# Patient Record
Sex: Female | Born: 2000
Health system: Southern US, Community
[De-identification: ages and names within clinical notes are randomized; demographics above are authoritative.]

## PROBLEM LIST (undated history)

## (undated) DIAGNOSIS — B977 Papillomavirus as the cause of diseases classified elsewhere: Secondary | ICD-10-CM

## (undated) DIAGNOSIS — N201 Calculus of ureter: Secondary | ICD-10-CM

## (undated) DIAGNOSIS — D369 Benign neoplasm, unspecified site: Secondary | ICD-10-CM

## (undated) DIAGNOSIS — K580 Irritable bowel syndrome with diarrhea: Secondary | ICD-10-CM

## (undated) DIAGNOSIS — Z9889 Other specified postprocedural states: Secondary | ICD-10-CM

## (undated) DIAGNOSIS — Z872 Personal history of diseases of the skin and subcutaneous tissue: Secondary | ICD-10-CM

## (undated) DIAGNOSIS — K589 Irritable bowel syndrome without diarrhea: Secondary | ICD-10-CM

## (undated) HISTORY — PX: TONSILLECTOMY: SUR1361

## (undated) HISTORY — PX: TUMOR REMOVAL: SHX12

## (undated) HISTORY — PX: WISDOM TOOTH EXTRACTION: SHX21

---
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
## 2019-03-18 ENCOUNTER — Emergency Department
Admission: EM | Admit: 2019-03-18 | Discharge: 2019-03-19 | Disposition: A | Discharge status: Home / Self Care | Payer: 59 | Attending: Emergency Medicine | Admitting: Emergency Medicine

## 2019-03-18 ENCOUNTER — Other Ambulatory Visit: Payer: Self-pay

## 2019-03-18 ENCOUNTER — Emergency Department: Payer: 59

## 2019-03-18 DIAGNOSIS — K529 Noninfective gastroenteritis and colitis, unspecified: Secondary | ICD-10-CM | POA: Diagnosis not present

## 2019-03-18 DIAGNOSIS — R1031 Right lower quadrant pain: Secondary | ICD-10-CM | POA: Diagnosis present

## 2019-03-18 DIAGNOSIS — Z79899 Other long term (current) drug therapy: Secondary | ICD-10-CM | POA: Diagnosis not present

## 2019-03-18 HISTORY — DX: Irritable bowel syndrome without diarrhea: K58.9

## 2019-03-18 HISTORY — DX: Benign neoplasm, unspecified site: D36.9

## 2019-03-18 LAB — URINALYSIS, COMPLETE (UACMP) WITH MICROSCOPIC
Bacteria, UA: NONE SEEN
Bilirubin Urine: NEGATIVE
Glucose, UA: NEGATIVE mg/dL
Ketones, ur: NEGATIVE mg/dL
Leukocytes,Ua: NEGATIVE
Nitrite: NEGATIVE
Protein, ur: NEGATIVE mg/dL
Specific Gravity, Urine: 1.018 (ref 1.005–1.030)
pH: 5 (ref 5.0–8.0)

## 2019-03-18 LAB — COMPREHENSIVE METABOLIC PANEL
ALT: 29 U/L (ref 0–44)
AST: 21 U/L (ref 15–41)
Albumin: 4.2 g/dL (ref 3.5–5.0)
Alkaline Phosphatase: 95 U/L (ref 38–126)
Anion gap: 6 (ref 5–15)
BUN: 7 mg/dL (ref 6–20)
CO2: 25 mmol/L (ref 22–32)
Calcium: 9.4 mg/dL (ref 8.9–10.3)
Chloride: 105 mmol/L (ref 98–111)
Creatinine, Ser: 0.79 mg/dL (ref 0.44–1.00)
GFR calc Af Amer: >60 mL/min (ref >60)
GFR calc non Af Amer: >60 mL/min (ref >60)
Glucose, Bld: 102 mg/dL — ABNORMAL HIGH (ref 70–99)
Potassium: 3.9 mmol/L (ref 3.5–5.1)
Sodium: 136 mmol/L (ref 135–145)
Total Bilirubin: 0.7 mg/dL (ref 0.3–1.2)
Total Protein: 8.1 g/dL (ref 6.5–8.1)

## 2019-03-18 LAB — CBC
HCT: 45 % (ref 36.0–46.0)
Hemoglobin: 15.5 g/dL — ABNORMAL HIGH (ref 12.0–15.0)
MCH: 30.1 pg (ref 26.0–34.0)
MCHC: 34.4 g/dL (ref 30.0–36.0)
MCV: 87.4 fL (ref 80.0–100.0)
Platelets: 410 10*3/uL — ABNORMAL HIGH (ref 150–400)
RBC: 5.15 MIL/uL — ABNORMAL HIGH (ref 3.87–5.11)
RDW: 11.6 % (ref 11.5–15.5)
WBC: 10.4 10*3/uL (ref 4.0–10.5)
nRBC: 0 % (ref 0.0–0.2)

## 2019-03-18 LAB — POCT PREGNANCY, URINE: Preg Test, Ur: NEGATIVE

## 2019-03-18 LAB — LIPASE, BLOOD: Lipase: 18 U/L (ref 11–51)

## 2019-03-18 MED ORDER — NAPROXEN 500 MG PO TABS: 500.0000 mg | ORAL_TABLET | Freq: Two times a day (BID) | ORAL | 0 refills | Status: DC

## 2019-03-18 MED ORDER — SODIUM CHLORIDE 0.9% FLUSH: 3.0000 mL | Freq: Once | INTRAVENOUS | Status: DC

## 2019-03-18 MED ORDER — ONDANSETRON 4 MG PO TBDP: 4.0000 mg | ORAL_TABLET | Freq: Three times a day (TID) | ORAL | 0 refills | Status: DC | PRN

## 2019-03-18 MED ORDER — SODIUM CHLORIDE 0.9 % IV BOLUS
1000.0000 mL | Freq: Once | INTRAVENOUS | Status: AC
  Administered 2019-03-18: 21:00:00 1000 mL via INTRAVENOUS

## 2019-03-18 MED ORDER — IOHEXOL 300 MG/ML  SOLN
100.0000 mL | Freq: Once | INTRAMUSCULAR | Status: AC | PRN
  Administered 2019-03-18: 22:00:00 100 mL via INTRAVENOUS

## 2019-03-18 MED ORDER — METRONIDAZOLE 500 MG PO TABS: 500.0000 mg | ORAL_TABLET | Freq: Three times a day (TID) | ORAL | 0 refills | Status: DC

## 2019-03-18 NOTE — ED Notes (Signed)
Patient to CT at this time

## 2019-03-18 NOTE — ED Triage Notes (Signed)
Pt to the er for abd pain. Pt states she has a hx of IBS and is taking her medication. Pt reports this pain being more sever and not the normal. Pain was over her entire abd earlier, pt took pepto with sone relief. Now the pain is RLQ.

## 2019-03-18 NOTE — ED Provider Notes (Signed)
[facility] Emergency Department Provider Note  ____________________________________________  Time seen: Approximately 11:12 PM  I have reviewed the triage vital signs and the nursing notes.   HISTORY  Chief Complaint Abdominal Pain    HPI Krystal Morton is a 19 y.o. female pes planovalgus who comes to the ED complaining of abdominal pain in the right lower quadrant, worsening for the past 2 days.  Decreased appetite today.  Associated nausea as well as diarrhea.  No body aches fevers or chills.  Nonradiating, no aggravating or alleviating factors, moderate      Past Medical History:  Diagnosis Date  . Benign tumor   . IBS (irritable bowel syndrome)      There are no problems to display for this patient.    Past Surgical History:  Procedure Laterality Date  . TONSILLECTOMY    . TUMOR REMOVAL    . WISDOM TOOTH EXTRACTION       Prior to Admission medications   Medication Sig Start Date End Date Taking? Authorizing Provider  dicyclomine (BENTYL) 20 MG tablet Take 1 tablet (20 mg total) by mouth 2 (two) times daily. 03/18/14   Harden Mo, MD  diphenoxylate-atropine (LOMOTIL) 2.5-0.025 MG per tablet Take 1 tablet by mouth 4 (four) times daily as needed for diarrhea or loose stools. 03/18/14   Harden Mo, MD  lactobacillus acidophilus & bulgar (LACTINEX) chewable tablet Chew 1 tablet by mouth 3 (three) times daily with meals. 03/18/14   Harden Mo, MD  metroNIDAZOLE (FLAGYL) 500 MG tablet Take 1 tablet (500 mg total) by mouth 3 (three) times daily. 03/18/19   Carrie Mew, MD  naproxen (NAPROSYN) 500 MG tablet Take 1 tablet (500 mg total) by mouth 2 (two) times daily with a meal. 03/18/19   Carrie Mew, MD  ondansetron (ZOFRAN ODT) 4 MG disintegrating tablet Take 1 tablet (4 mg total) by mouth every 8 (eight) hours as needed for nausea or vomiting. 03/18/19   Carrie Mew, MD     Allergies Dicyclomine   No family history  on file.  Social History Social History   Tobacco Use  . Smoking status: Never Smoker  . Smokeless tobacco: Never Used  Substance Use Topics  . Alcohol use: No  . Drug use: No    Review of Systems  Constitutional:   No fever or chills.  ENT:   No sore throat. No rhinorrhea. Cardiovascular:   No chest pain or syncope. Respiratory:   No dyspnea or cough. Gastrointestinal: Positive as above for abdominal pain and diarrhea. Musculoskeletal:   Negative for focal pain or swelling All other systems reviewed and are negative except as documented above in ROS and HPI.  ____________________________________________   PHYSICAL EXAM:  VITAL SIGNS: ED Triage Vitals [03/18/19 1651]  Enc Vitals Group     BP (!) 151/87     Pulse Rate 86     Resp 16     Temp 98.2 F (36.8 C)     Temp Source Oral     SpO2 99 %     Weight 175 lb (79.4 kg)     Height 5\' 6"  (1.676 m)     Head Circumference      Peak Flow      Pain Score 4     Pain Loc      Pain Edu?      Excl. in [place]?     Vital signs reviewed, nursing assessments reviewed.   Constitutional:   Alert and  oriented. Non-toxic appearance. Eyes:   Conjunctivae are normal. EOMI. PERRL. ENT      Head:   Normocephalic and atraumatic.      Nose:   Wearing a mask.      Mouth/Throat:   Wearing a mask.      Neck:   No meningismus. Full ROM. Hematological/Lymphatic/Immunilogical:   No cervical lymphadenopathy. Cardiovascular:   RRR. Symmetric bilateral radial and DP pulses.  No murmurs. Cap refill less than 2 seconds. Respiratory:   Normal respiratory effort without tachypnea/retractions. Breath sounds are clear and equal bilaterally. No wheezes/rales/rhonchi. Gastrointestinal:   Soft with focal right lower quadrant tenderness. Non distended. There is no CVA tenderness.  No rebound, rigidity, or guarding. Musculoskeletal:   Normal range of motion in all extremities. No joint effusions.  No lower extremity tenderness.  No edema. Neurologic:    Normal speech and language.  Motor grossly intact. No acute focal neurologic deficits are appreciated.  Skin:    Skin is warm, dry and intact. No rash noted.  No petechiae, purpura, or bullae.  ____________________________________________    LABS (pertinent positives/negatives) (all labs ordered are listed, but only abnormal results are displayed) Labs Reviewed  COMPREHENSIVE METABOLIC PANEL - Abnormal; Notable for the following components:      Result Value   Glucose, Bld 102 (*)    All other components within normal limits  CBC - Abnormal; Notable for the following components:   RBC 5.15 (*)    Hemoglobin 15.5 (*)    Platelets 410 (*)    All other components within normal limits  URINALYSIS, COMPLETE (UACMP) WITH MICROSCOPIC - Abnormal; Notable for the following components:   Color, Urine YELLOW (*)    APPearance CLEAR (*)    Hgb urine dipstick SMALL (*)    All other components within normal limits  LIPASE, BLOOD  POC URINE PREG, ED  POCT PREGNANCY, URINE   ____________________________________________   EKG    ____________________________________________    RADIOLOGY  CT ABDOMEN PELVIS W CONTRAST  Result Date: 03/18/2019 CLINICAL DATA:  Right lower quadrant abdominal pain. EXAM: CT ABDOMEN AND PELVIS WITH CONTRAST TECHNIQUE: Multidetector CT imaging of the abdomen and pelvis was performed using the standard protocol following bolus administration of intravenous contrast. CONTRAST:  111mL OMNIPAQUE IOHEXOL 300 MG/ML  SOLN COMPARISON:  None. FINDINGS: Lower chest: The lung bases are clear. The heart size is normal. Hepatobiliary: The liver is normal. Normal gallbladder.There is no biliary ductal dilation. Pancreas: Normal contours without ductal dilatation. No peripancreatic fluid collection. Spleen: No splenic laceration or hematoma. Adrenals/Urinary Tract: --Adrenal glands: No adrenal hemorrhage. --Right kidney/ureter: No hydronephrosis or perinephric hematoma. --Left  kidney/ureter: There is a nonobstructing 4 mm stone in the interpolar region of the left kidney. --Urinary bladder: Unremarkable. Stomach/Bowel: --Stomach/Duodenum: No hiatal hernia or other gastric abnormality. Normal duodenal course and caliber. --Small bowel: There may be some mild wall thickening of the terminal ileum. --Colon: No focal abnormality. --Appendix: Normal. Vascular/Lymphatic: Normal course and caliber of the major abdominal vessels. --No retroperitoneal lymphadenopathy. --there are few enlarged lymph nodes in the right lower quadrant measuring up to approximately 2 cm. --No pelvic or inguinal lymphadenopathy. Reproductive: Unremarkable Other: No ascites or free air. The abdominal wall is normal. Musculoskeletal. No acute displaced fractures. IMPRESSION: 1. Possible mild wall thickening of the terminal ileum with enlarged right lower quadrant lymph nodes can be seen in patients with mesenteric adenitis or infectious inflammatory enteritis. 2. Normal appendix. 3. Nonobstructive left nephrolithiasis. Electronically Signed   By:  Constance Holster M.D.   On: 03/18/2019 22:08    ____________________________________________   PROCEDURES Procedures  ____________________________________________  DIFFERENTIAL DIAGNOSIS   Appendicitis, enteritis, ovarian cyst , IBS  CLINICAL IMPRESSION / ASSESSMENT AND PLAN / ED COURSE  Medications ordered in the ED: Medications  sodium chloride flush (NS) 0.9 % injection 3 mL (has no administration in time range)  sodium chloride 0.9 % bolus 1,000 mL (1,000 mLs Intravenous New Bag/Given 03/18/19 2037)  iohexol (OMNIPAQUE) 300 MG/ML solution 100 mL (100 mLs Intravenous Contrast Given 03/18/19 2150)    Pertinent labs & imaging results that were available during my care of the patient were reviewed by me and considered in my medical decision making (see chart for details).  Krystal Morton was evaluated in Emergency Department on 03/18/2019 for the  symptoms described in the history of present illness. She was evaluated in the context of the global COVID-19 pandemic, which necessitated consideration that the patient might be at risk for infection with the SARS-CoV-2 virus that causes COVID-19. Institutional protocols and algorithms that pertain to the evaluation of patients at risk for COVID-19 are in a state of rapid change based on information released by regulatory bodies including the CDC and federal and state organizations. These policies and algorithms were followed during the patient's care in the ED.   Patient presents with right lower quadrant abdominal pain.  Vital signs are normal.  Exam concerning for appendicitis primarily.  CT scan obtained which shows a normal appendix but does show focal inflammation of the terminal ileum patient patient lymphadenopathy consistent with a focal enteritis.  Most likely this would be self-limited, but given her history of IBS, will prescribe a course of Flagyl to take as well.  Advised to follow-up with her doctor for continued monitoring of symptoms, further evaluation for Crohn's as warranted by gastroenterologist.  She is ambulatory and nontoxic, stable for discharge home.      ____________________________________________   FINAL CLINICAL IMPRESSION(S) / ED DIAGNOSES    Final diagnoses:  Right lower quadrant abdominal pain  Enteritis     ED Discharge Orders         Ordered    metroNIDAZOLE (FLAGYL) 500 MG tablet  3 times daily     03/18/19 2310    ondansetron (ZOFRAN ODT) 4 MG disintegrating tablet  Every 8 hours PRN     03/18/19 2310    naproxen (NAPROSYN) 500 MG tablet  2 times daily with meals     03/18/19 2310          Portions of this note were generated with dragon dictation software. Dictation errors may occur despite best attempts at proofreading.   Carrie Mew, MD 03/18/19 2315

## 2019-03-18 NOTE — Discharge Instructions (Signed)
Please take anti-inflammatory medicine such as ibuprofen or aleve or pain.  Taking metronidazole as prescribed can also help with this pain.  Please follow up with your Gastroenterologist for further monitoring of your symptoms.  Your lab tests and CT scan report are copied below:  Results for orders placed or performed during the hospital encounter of 03/18/19  Lipase, blood  Result Value Ref Range   Lipase 18 11 - 51 U/L  Comprehensive metabolic panel  Result Value Ref Range   Sodium 136 135 - 145 mmol/L   Potassium 3.9 3.5 - 5.1 mmol/L   Chloride 105 98 - 111 mmol/L   CO2 25 22 - 32 mmol/L   Glucose, Bld 102 (H) 70 - 99 mg/dL   BUN 7 6 - 20 mg/dL   Creatinine, Ser 0.79 0.44 - 1.00 mg/dL   Calcium 9.4 8.9 - 10.3 mg/dL   Total Protein 8.1 6.5 - 8.1 g/dL   Albumin 4.2 3.5 - 5.0 g/dL   AST 21 15 - 41 U/L   ALT 29 0 - 44 U/L   Alkaline Phosphatase 95 38 - 126 U/L   Total Bilirubin 0.7 0.3 - 1.2 mg/dL   GFR calc non Af Amer >60 >60 mL/min   GFR calc Af Amer >60 >60 mL/min   Anion gap 6 5 - 15  CBC  Result Value Ref Range   WBC 10.4 4.0 - 10.5 K/uL   RBC 5.15 (H) 3.87 - 5.11 MIL/uL   Hemoglobin 15.5 (H) 12.0 - 15.0 g/dL   HCT 45.0 36.0 - 46.0 %   MCV 87.4 80.0 - 100.0 fL   MCH 30.1 26.0 - 34.0 pg   MCHC 34.4 30.0 - 36.0 g/dL   RDW 11.6 11.5 - 15.5 %   Platelets 410 (H) 150 - 400 K/uL   nRBC 0.0 0.0 - 0.2 %  Urinalysis, Complete w Microscopic  Result Value Ref Range   Color, Urine YELLOW (A) YELLOW   APPearance CLEAR (A) CLEAR   Specific Gravity, Urine 1.018 1.005 - 1.030   pH 5.0 5.0 - 8.0   Glucose, UA NEGATIVE NEGATIVE mg/dL   Hgb urine dipstick SMALL (A) NEGATIVE   Bilirubin Urine NEGATIVE NEGATIVE   Ketones, ur NEGATIVE NEGATIVE mg/dL   Protein, ur NEGATIVE NEGATIVE mg/dL   Nitrite NEGATIVE NEGATIVE   Leukocytes,Ua NEGATIVE NEGATIVE   RBC / HPF 0-5 0 - 5 RBC/hpf   WBC, UA 0-5 0 - 5 WBC/hpf   Bacteria, UA NONE SEEN NONE SEEN   Squamous Epithelial / LPF 0-5 0 - 5   Mucus PRESENT   Pregnancy, urine POC  Result Value Ref Range   Preg Test, Ur NEGATIVE NEGATIVE   CT ABDOMEN PELVIS W CONTRAST  Result Date: 03/18/2019 CLINICAL DATA:  Right lower quadrant abdominal pain. EXAM: CT ABDOMEN AND PELVIS WITH CONTRAST TECHNIQUE: Multidetector CT imaging of the abdomen and pelvis was performed using the standard protocol following bolus administration of intravenous contrast. CONTRAST:  127mL OMNIPAQUE IOHEXOL 300 MG/ML  SOLN COMPARISON:  None. FINDINGS: Lower chest: The lung bases are clear. The heart size is normal. Hepatobiliary: The liver is normal. Normal gallbladder.There is no biliary ductal dilation. Pancreas: Normal contours without ductal dilatation. No peripancreatic fluid collection. Spleen: No splenic laceration or hematoma. Adrenals/Urinary Tract: --Adrenal glands: No adrenal hemorrhage. --Right kidney/ureter: No hydronephrosis or perinephric hematoma. --Left kidney/ureter: There is a nonobstructing 4 mm stone in the interpolar region of the left kidney. --Urinary bladder: Unremarkable. Stomach/Bowel: --Stomach/Duodenum: No hiatal hernia  or other gastric abnormality. Normal duodenal course and caliber. --Small bowel: There may be some mild wall thickening of the terminal ileum. --Colon: No focal abnormality. --Appendix: Normal. Vascular/Lymphatic: Normal course and caliber of the major abdominal vessels. --No retroperitoneal lymphadenopathy. --there are few enlarged lymph nodes in the right lower quadrant measuring up to approximately 2 cm. --No pelvic or inguinal lymphadenopathy. Reproductive: Unremarkable Other: No ascites or free air. The abdominal wall is normal. Musculoskeletal. No acute displaced fractures. IMPRESSION: 1. Possible mild wall thickening of the terminal ileum with enlarged right lower quadrant lymph nodes can be seen in patients with mesenteric adenitis or infectious inflammatory enteritis. 2. Normal appendix. 3. Nonobstructive left nephrolithiasis.  Electronically Signed   By: Constance Holster M.D.   On: 03/18/2019 22:08

## 2019-03-18 NOTE — ED Notes (Signed)
Resting quietly at this time, awaiting to go to CT.

## 2019-03-18 NOTE — ED Notes (Signed)
Patient given blankets and pillows for comfort.

## 2019-09-10 ENCOUNTER — Other Ambulatory Visit: Payer: Self-pay

## 2019-09-10 ENCOUNTER — Encounter: Payer: Self-pay | Admitting: Emergency Medicine

## 2019-09-10 ENCOUNTER — Emergency Department
Admission: EM | Admit: 2019-09-10 | Discharge: 2019-09-10 | Disposition: A | Discharge status: Home / Self Care | Payer: PRIVATE HEALTH INSURANCE | Attending: Emergency Medicine | Admitting: Emergency Medicine

## 2019-09-10 ENCOUNTER — Emergency Department: Payer: PRIVATE HEALTH INSURANCE

## 2019-09-10 DIAGNOSIS — W228XXA Striking against or struck by other objects, initial encounter: Secondary | ICD-10-CM | POA: Diagnosis not present

## 2019-09-10 DIAGNOSIS — Y999 Unspecified external cause status: Secondary | ICD-10-CM | POA: Insufficient documentation

## 2019-09-10 DIAGNOSIS — Y939 Activity, unspecified: Secondary | ICD-10-CM | POA: Insufficient documentation

## 2019-09-10 DIAGNOSIS — Z79899 Other long term (current) drug therapy: Secondary | ICD-10-CM | POA: Insufficient documentation

## 2019-09-10 DIAGNOSIS — S0990XA Unspecified injury of head, initial encounter: Secondary | ICD-10-CM | POA: Insufficient documentation

## 2019-09-10 DIAGNOSIS — Y9289 Other specified places as the place of occurrence of the external cause: Secondary | ICD-10-CM | POA: Diagnosis not present

## 2019-09-10 MED ORDER — ONDANSETRON 8 MG PO TBDP
8.0000 mg | ORAL_TABLET | Freq: Once | ORAL | Status: AC
  Administered 2019-09-10: 8 mg via ORAL
  Filled 2019-09-10: qty 1

## 2019-09-10 NOTE — ED Triage Notes (Signed)
States she hit her head on metal door at work early this am  Positive nausea   W/c

## 2019-09-10 NOTE — ED Provider Notes (Signed)
[facility] Emergency Department Provider Note   ____________________________________________   First MD Initiated Contact with Patient 09/10/19 [phone]     (approximate)  I have reviewed the triage vital signs and the nursing notes.   HISTORY  Chief Complaint Headache and Head Injury    HPI Krystal Morton is a 19 y.o. female patient complain headache and nausea secondary to contusion metal door earlier this morning.  Incident occurred at work.  Patient rates her pain as 6/10.  Patient denies vertigo or vision loss.  Patient described the pain is "achy".  No palliative measure for complaint.         Past Medical History:  Diagnosis Date  . Benign tumor   . IBS (irritable bowel syndrome)     There are no problems to display for this patient.   Past Surgical History:  Procedure Laterality Date  . TONSILLECTOMY    . TUMOR REMOVAL    . WISDOM TOOTH EXTRACTION      Prior to Admission medications   Medication Sig Start Date End Date Taking? Authorizing Provider  lactobacillus acidophilus & bulgar (LACTINEX) chewable tablet Chew 1 tablet by mouth 3 (three) times daily with meals. 03/18/14   Harden Mo, MD    Allergies Dicyclomine  No family history on file.  Social History Social History   Tobacco Use  . Smoking status: Never Smoker  . Smokeless tobacco: Never Used  Substance Use Topics  . Alcohol use: No  . Drug use: No    Review of Systems Constitutional: No fever/chills Eyes: No visual changes. ENT: No sore throat. Cardiovascular: Denies chest pain. Respiratory: Denies shortness of breath. Gastrointestinal: No abdominal pain.  Nausea without vomiting.  No diarrhea.  No constipation. Genitourinary: Negative for dysuria. Musculoskeletal: Negative for back pain. Skin: Negative for rash. Neurological: Positive for headaches, but denies focal weakness or numbness.  Allergic/Immunilogical:  Bentyl  ____________________________________________   PHYSICAL EXAM:  VITAL SIGNS: ED Triage Vitals  Enc Vitals Group     BP 09/10/19 0804 (!) 150/93     Pulse Rate 09/10/19 0804 82     Resp 09/10/19 0804 18     Temp 09/10/19 0804 98.2 F (36.8 C)     Temp Source 09/10/19 0804 Oral     SpO2 09/10/19 0804 99 %     Weight 09/10/19 0805 180 lb (81.6 kg)     Height 09/10/19 0805 5\' 6"  (1.676 m)     Head Circumference --      Peak Flow --      Pain Score 09/10/19 0816 6     Pain Loc --      Pain Edu? --      Excl. in [place]? --     Constitutional: Alert and oriented. Well appearing and in no acute distress. Eyes: Conjunctivae are normal. PERRL. EOMI. Head: Atraumatic. Nose: No congestion/rhinnorhea. Mouth/Throat: Mucous membranes are moist.  Oropharynx non-erythematous. Cardiovascular: Normal rate, regular rhythm. Grossly normal heart sounds.  Good peripheral circulation. Respiratory: Normal respiratory effort.  No retractions. Lungs CTAB. Gastrointestinal: Soft and nontender. No distention. No abdominal bruits. No CVA tenderness. Genitourinary: Deferred Neurologic:  Normal speech and language. No gross focal neurologic deficits are appreciated. No gait instability. Skin:  Skin is warm, dry and intact. No rash noted. Psychiatric: Mood and affect are normal. Speech and behavior are normal.  ____________________________________________   LABS (all labs ordered are listed, but only abnormal results are displayed)  Labs Reviewed - No data to  display ____________________________________________  EKG   ____________________________________________  RADIOLOGY  ED MD interpretation:    Official radiology report(s): CT Head Wo Contrast  Result Date: 09/10/2019 CLINICAL DATA:  Posterior right-sided head trauma. EXAM: CT HEAD WITHOUT CONTRAST TECHNIQUE: Contiguous axial images were obtained from the base of the skull through the vertex without intravenous contrast. COMPARISON:   None. FINDINGS: Brain: No evidence of acute infarction, hemorrhage, hydrocephalus, extra-axial collection or mass lesion/mass effect. Vascular: No hyperdense vessel or unexpected calcification. Skull: Normal. Negative for fracture or focal lesion. Sinuses/Orbits: No acute finding. Other: Mild subcutaneous soft tissue swelling over the right occipital region. No well-defined soft tissue hematoma. IMPRESSION: 1. No acute intracranial findings. 2. Mild soft tissue swelling over the right occipital region. No calvarial fracture. Electronically Signed   By: Davina Poke D.O.   On: 09/10/2019 10:18    ____________________________________________   PROCEDURES  Procedure(s) performed (including Critical Care):  Procedures   ____________________________________________   INITIAL IMPRESSION / ASSESSMENT AND PLAN / ED COURSE  As part of my medical decision making, I reviewed the following data within the [facility]     Patient presents with headache status post contusion by door at work today.  Patient continues to complain of nausea.  Discussed no acute findings on CT of the head.  Patient complaint physical exam is consistent with minor head injury.  Patient given discharge care instruction work note.  Patient advised return to ED if condition worsens.    Krystal Morton was evaluated in Emergency Department on 09/10/2019 for the symptoms described in the history of present illness. She was evaluated in the context of the global COVID-19 pandemic, which necessitated consideration that the patient might be at risk for infection with the SARS-CoV-2 virus that causes COVID-19. Institutional protocols and algorithms that pertain to the evaluation of patients at risk for COVID-19 are in a state of rapid change based on information released by regulatory bodies including the CDC and federal and state organizations. These policies and algorithms were followed during the patient's care  in the ED.       ____________________________________________   FINAL CLINICAL IMPRESSION(S) / ED DIAGNOSES  Final diagnoses:  Minor head injury, initial encounter     ED Discharge Orders    None       Note:  This document was prepared using Dragon voice recognition software and may include unintentional dictation errors.    Sable Feil, PA-C 09/10/19 1233    Vladimir Crofts, MD 09/10/19 [phone]

## 2019-09-10 NOTE — Discharge Instructions (Signed)
Follow discharge care instructions and take Tylenol as needed for headache.

## 2019-12-24 ENCOUNTER — Other Ambulatory Visit: Payer: Self-pay

## 2019-12-24 ENCOUNTER — Ambulatory Visit
Admission: EM | Admit: 2019-12-24 | Discharge: 2019-12-24 | Disposition: A | Discharge status: Home / Self Care | Payer: BC Managed Care – PPO | Attending: Emergency Medicine | Admitting: Emergency Medicine

## 2019-12-24 DIAGNOSIS — N23 Unspecified renal colic: Secondary | ICD-10-CM | POA: Diagnosis not present

## 2019-12-24 LAB — POCT URINALYSIS DIP (MANUAL ENTRY)
Bilirubin, UA: NEGATIVE
Glucose, UA: NEGATIVE mg/dL
Ketones, POC UA: NEGATIVE mg/dL
Nitrite, UA: NEGATIVE
Protein Ur, POC: NEGATIVE mg/dL
Spec Grav, UA: >=1.03 — AB (ref 1.010–1.025)
Urobilinogen, UA: 0.2 E.U./dL
pH, UA: 6 (ref 5.0–8.0)

## 2019-12-24 LAB — POCT URINE PREGNANCY: Preg Test, Ur: NEGATIVE

## 2019-12-24 MED ORDER — ONDANSETRON 4 MG PO TBDP: 4.0000 mg | ORAL_TABLET | Freq: Three times a day (TID) | ORAL | 0 refills | Status: DC | PRN

## 2019-12-24 MED ORDER — KETOROLAC TROMETHAMINE 10 MG PO TABS: 10.0000 mg | ORAL_TABLET | Freq: Four times a day (QID) | ORAL | 0 refills | Status: DC | PRN

## 2019-12-24 NOTE — Discharge Instructions (Signed)
Drink plenty of fluids (water, sugar-free drinks) throughout the day. May take Toradol as needed for pain: No ibuprofen, Motrin, Advil, Aleve, naproxen (NSAIDs) while taking this. Zofran as needed for nausea. Use urine strainer when urinating.   If you catch a stone, please follow-up with your PCP or urology for further evaluation. Go to ER for blood in urine, worsening abdominal or back pain, vomiting, fever.

## 2019-12-24 NOTE — ED Triage Notes (Signed)
Patient complains of flank pain on the left side and nausea since Saturday, and the pain has begun to radiate under her ribcage on her left side. Pt is aox4 and ambulatory.

## 2019-12-24 NOTE — ED Provider Notes (Signed)
[facility]    CSN: [id number] Arrival date & time: 12/24/19  1700      History   Chief Complaint Chief Complaint  Patient presents with  . Flank Pain    Left side since saturday    HPI Krystal Morton is a 19 y.o. female  Patient is a 19 y.o. female presents with left flank pain with radiation to the abdomen. Onset of symptoms was 2 days ago with gradually worsening course since that time. Patient describes the pain as aching, pressure-like, sharp and shooting, becoming more constant and rated as moderate. The patient has had nausea and no diaphoresis. There has been no fever or chills. The patient is not complaining of dysuria or frequency. Risk factors for urolithiasis: h/o stone on CT earlier this year.     Past Medical History:  Diagnosis Date  . Benign tumor   . IBS (irritable bowel syndrome)     There are no problems to display for this patient.   Past Surgical History:  Procedure Laterality Date  . TONSILLECTOMY    . TUMOR REMOVAL    . WISDOM TOOTH EXTRACTION      OB History   No obstetric history on file.      Home Medications    Prior to Admission medications   Medication Sig Start Date End Date Taking? Authorizing Provider  ketorolac (TORADOL) 10 MG tablet Take 1 tablet (10 mg total) by mouth every 6 (six) hours as needed. 12/24/19   Hall-Potvin, [place], PA-C  lactobacillus acidophilus & bulgar (LACTINEX) chewable tablet Chew 1 tablet by mouth 3 (three) times daily with meals. 03/18/14   Harden Mo, MD  ondansetron (ZOFRAN ODT) 4 MG disintegrating tablet Take 1 tablet (4 mg total) by mouth every 8 (eight) hours as needed for nausea or vomiting. 12/24/19   Hall-Potvin, [place], PA-C    Family History History reviewed. No pertinent family history.  Social History Social History   Tobacco Use  . Smoking status: Never Smoker  . Smokeless tobacco: Never Used  Vaping Use  . Vaping Use: Never used  Substance Use Topics  .  Alcohol use: No  . Drug use: No     Allergies   Dicyclomine   Review of Systems Review of Systems  Constitutional: Negative for fatigue and fever.  HENT: Negative for ear pain, sinus pain, sore throat and voice change.   Eyes: Negative for pain, redness and visual disturbance.  Respiratory: Negative for cough and shortness of breath.   Cardiovascular: Negative for chest pain and palpitations.  Gastrointestinal: Positive for abdominal pain and nausea. Negative for abdominal distention, blood in stool, constipation, diarrhea and vomiting.  Musculoskeletal: Positive for back pain. Negative for arthralgias and myalgias.  Skin: Negative for rash and wound.  Neurological: Negative for syncope and headaches.     Physical Exam Triage Vital Signs ED Triage Vitals  Enc Vitals Group     BP 12/24/19 1033 130/79     Pulse Rate 12/24/19 1033 85     Resp 12/24/19 1033 18     Temp 12/24/19 1033 98 F (36.7 C)     Temp Source 12/24/19 1033 Oral     SpO2 12/24/19 1033 98 %     Weight --      Height --      Head Circumference --      Peak Flow --      Pain Score 12/24/19 1035 0     Pain Loc --  Pain Edu? --      Excl. in [place]? --    No data found.  Updated Vital Signs BP 130/79 (BP Location: Left Arm)   Pulse 85   Temp 98 F (36.7 C) (Oral)   Resp 18   LMP  (LMP Unknown)   SpO2 98%   Visual Acuity Right Eye Distance:   Left Eye Distance:   Bilateral Distance:    Right Eye Near:   Left Eye Near:    Bilateral Near:     Physical Exam Constitutional:      General: She is not in acute distress. HENT:     Head: Normocephalic and atraumatic.  Eyes:     General: No scleral icterus.    Pupils: Pupils are equal, round, and reactive to light.  Cardiovascular:     Rate and Rhythm: Normal rate.  Pulmonary:     Effort: Pulmonary effort is normal.  Abdominal:     General: Bowel sounds are normal.     Palpations: Abdomen is soft.     Tenderness: There is no abdominal  tenderness. There is left CVA tenderness. There is no right CVA tenderness or guarding.  Skin:    Coloration: Skin is not jaundiced or pale.     Findings: No rash.  Neurological:     Mental Status: She is alert and oriented to person, place, and time.      UC Treatments / Results  Labs (all labs ordered are listed, but only abnormal results are displayed) Labs Reviewed  POCT URINALYSIS DIP (MANUAL ENTRY) - Abnormal; Notable for the following components:      Result Value   Spec Grav, UA >=1.030 (*)    Blood, UA small (*)    Leukocytes, UA Small (1+) (*)    All other components within normal limits  URINE CULTURE  POCT URINE PREGNANCY    EKG   Radiology No results found.  Procedures Procedures (including critical care time)  Medications Ordered in UC Medications - No data to display  Initial Impression / Assessment and Plan / UC Course  I have reviewed the triage vital signs and the nursing notes.  Pertinent labs & imaging results that were available during my care of the patient were reviewed by me and considered in my medical decision making (see chart for details).     Urine as above, the patient without symptoms of UTI: We will send for culture.  Per chart review CT on 03/18/19 w/ 4 mm L nonobstructing stone in interpolar region.  Low concern for shingles, though discussed return precautions thereof.  Will treat empirically for possible nonobstructing renal calculi VS MSK.  Provided urology contact information, strainer, sterile cup should patient obtain stone.  ER return precautions discussed, pt verbalized understanding and is agreeable to plan. Final Clinical Impressions(s) / UC Diagnoses   Final diagnoses:  Renal colic on left side     Discharge Instructions     Drink plenty of fluids (water, sugar-free drinks) throughout the day. May take Toradol as needed for pain: No ibuprofen, Motrin, Advil, Aleve, naproxen (NSAIDs) while taking this. Zofran as needed  for nausea. Use urine strainer when urinating.   If you catch a stone, please follow-up with your PCP or urology for further evaluation. Go to ER for blood in urine, worsening abdominal or back pain, vomiting, fever.    ED Prescriptions    Medication Sig Dispense Auth. Provider   ketorolac (TORADOL) 10 MG tablet Take 1 tablet (10 mg  total) by mouth every 6 (six) hours as needed. 20 tablet Hall-Potvin, [place], PA-C   ondansetron (ZOFRAN ODT) 4 MG disintegrating tablet Take 1 tablet (4 mg total) by mouth every 8 (eight) hours as needed for nausea or vomiting. 21 tablet Hall-Potvin, [place], PA-C     PDMP not reviewed this encounter.   Hall-Potvin, [place], [place] 12/24/19 1149

## 2019-12-25 LAB — URINE CULTURE: Culture: <10000 — AB

## 2020-04-21 DIAGNOSIS — Z01419 Encounter for gynecological examination (general) (routine) without abnormal findings: Secondary | ICD-10-CM | POA: Diagnosis not present

## 2020-04-21 DIAGNOSIS — Z6833 Body mass index (BMI) 33.0-33.9, adult: Secondary | ICD-10-CM | POA: Diagnosis not present

## 2020-04-21 DIAGNOSIS — Z23 Encounter for immunization: Secondary | ICD-10-CM | POA: Diagnosis not present

## 2020-04-21 DIAGNOSIS — Z124 Encounter for screening for malignant neoplasm of cervix: Secondary | ICD-10-CM | POA: Diagnosis not present

## 2020-04-21 DIAGNOSIS — Z113 Encounter for screening for infections with a predominantly sexual mode of transmission: Secondary | ICD-10-CM | POA: Diagnosis not present

## 2020-09-17 ENCOUNTER — Emergency Department (HOSPITAL_COMMUNITY)
Admission: EM | Admit: 2020-09-17 | Discharge: 2020-09-17 | Disposition: A | Discharge status: Home / Self Care | Payer: BC Managed Care – PPO | Attending: Emergency Medicine | Admitting: Emergency Medicine

## 2020-09-17 ENCOUNTER — Other Ambulatory Visit: Payer: Self-pay

## 2020-09-17 ENCOUNTER — Ambulatory Visit
Admission: EM | Admit: 2020-09-17 | Discharge: 2020-09-17 | Discharge status: Against Medical Advice | Payer: BC Managed Care – PPO

## 2020-09-17 ENCOUNTER — Encounter (HOSPITAL_COMMUNITY): Payer: Self-pay

## 2020-09-17 ENCOUNTER — Emergency Department (HOSPITAL_COMMUNITY): Payer: BC Managed Care – PPO

## 2020-09-17 DIAGNOSIS — N132 Hydronephrosis with renal and ureteral calculous obstruction: Secondary | ICD-10-CM | POA: Diagnosis not present

## 2020-09-17 DIAGNOSIS — N200 Calculus of kidney: Secondary | ICD-10-CM | POA: Diagnosis not present

## 2020-09-17 DIAGNOSIS — N201 Calculus of ureter: Secondary | ICD-10-CM | POA: Diagnosis not present

## 2020-09-17 DIAGNOSIS — E860 Dehydration: Secondary | ICD-10-CM | POA: Insufficient documentation

## 2020-09-17 DIAGNOSIS — R112 Nausea with vomiting, unspecified: Secondary | ICD-10-CM

## 2020-09-17 DIAGNOSIS — R109 Unspecified abdominal pain: Secondary | ICD-10-CM | POA: Diagnosis not present

## 2020-09-17 DIAGNOSIS — N23 Unspecified renal colic: Secondary | ICD-10-CM | POA: Diagnosis not present

## 2020-09-17 LAB — BASIC METABOLIC PANEL
Anion gap: 11 (ref 5–15)
BUN: 10 mg/dL (ref 6–20)
CO2: 22 mmol/L (ref 22–32)
Calcium: 9.9 mg/dL (ref 8.9–10.3)
Chloride: 104 mmol/L (ref 98–111)
Creatinine, Ser: 1.08 mg/dL — ABNORMAL HIGH (ref 0.44–1.00)
GFR, Estimated: >60 mL/min (ref >60)
Glucose, Bld: 140 mg/dL — ABNORMAL HIGH (ref 70–99)
Potassium: 3.7 mmol/L (ref 3.5–5.1)
Sodium: 137 mmol/L (ref 135–145)

## 2020-09-17 LAB — URINALYSIS, ROUTINE W REFLEX MICROSCOPIC
Bilirubin Urine: NEGATIVE
Glucose, UA: NEGATIVE mg/dL
Hgb urine dipstick: NEGATIVE
Ketones, ur: 80 mg/dL — AB
Nitrite: NEGATIVE
Protein, ur: 100 mg/dL — AB
Specific Gravity, Urine: 1.02 (ref 1.005–1.030)
pH: 9 — ABNORMAL HIGH (ref 5.0–8.0)

## 2020-09-17 LAB — CBC
HCT: 47.2 % — ABNORMAL HIGH (ref 36.0–46.0)
Hemoglobin: 16.2 g/dL — ABNORMAL HIGH (ref 12.0–15.0)
MCH: 30.1 pg (ref 26.0–34.0)
MCHC: 34.3 g/dL (ref 30.0–36.0)
MCV: 87.6 fL (ref 80.0–100.0)
Platelets: 425 10*3/uL — ABNORMAL HIGH (ref 150–400)
RBC: 5.39 MIL/uL — ABNORMAL HIGH (ref 3.87–5.11)
RDW: 11.9 % (ref 11.5–15.5)
WBC: 13.3 10*3/uL — ABNORMAL HIGH (ref 4.0–10.5)
nRBC: 0 % (ref 0.0–0.2)

## 2020-09-17 LAB — I-STAT BETA HCG BLOOD, ED (MC, WL, AP ONLY): I-stat hCG, quantitative: <5 m[IU]/mL (ref <5)

## 2020-09-17 MED ORDER — SODIUM CHLORIDE 0.9 % IV BOLUS
1000.0000 mL | Freq: Once | INTRAVENOUS | Status: AC
  Administered 2020-09-17: 1000 mL via INTRAVENOUS

## 2020-09-17 MED ORDER — KETOROLAC TROMETHAMINE 30 MG/ML IJ SOLN
30.0000 mg | Freq: Once | INTRAMUSCULAR | Status: AC
  Administered 2020-09-17: 30 mg via INTRAVENOUS
  Filled 2020-09-17: qty 1

## 2020-09-17 MED ORDER — ONDANSETRON 4 MG PO TBDP: 4.0000 mg | ORAL_TABLET | Freq: Three times a day (TID) | ORAL | 0 refills | Status: DC | PRN

## 2020-09-17 MED ORDER — ONDANSETRON HCL 4 MG/2ML IJ SOLN
4.0000 mg | Freq: Once | INTRAMUSCULAR | Status: AC
  Administered 2020-09-17: 4 mg via INTRAVENOUS
  Filled 2020-09-17: qty 2

## 2020-09-17 MED ORDER — MORPHINE SULFATE (PF) 4 MG/ML IV SOLN
4.0000 mg | Freq: Once | INTRAVENOUS | Status: AC
  Administered 2020-09-17: 4 mg via INTRAVENOUS
  Filled 2020-09-17: qty 1

## 2020-09-17 MED ORDER — OXYCODONE-ACETAMINOPHEN 5-325 MG PO TABS
1.0000 | ORAL_TABLET | Freq: Four times a day (QID) | ORAL | 0 refills | Status: DC | PRN
Start: 2020-09-17 — End: 2020-12-11

## 2020-09-17 MED ORDER — HYDROMORPHONE HCL 1 MG/ML IJ SOLN
1.0000 mg | Freq: Once | INTRAMUSCULAR | Status: AC
  Administered 2020-09-17: 1 mg via INTRAVENOUS
  Filled 2020-09-17: qty 1

## 2020-09-17 MED ORDER — TAMSULOSIN HCL 0.4 MG PO CAPS: 0.4000 mg | ORAL_CAPSULE | Freq: Every day | ORAL | 0 refills | Status: DC

## 2020-09-17 NOTE — ED Notes (Signed)
Patient transported to CT 

## 2020-09-17 NOTE — ED Triage Notes (Signed)
Pt reports waking up from a nap around 1600 with severe left flank pain. 6 episodes of vomiting since then.

## 2020-09-17 NOTE — ED Provider Notes (Signed)
[facility] DEPT Provider Note   CSN: [id number] Arrival date & time: 09/17/20  1812     History Chief Complaint  Patient presents with   Flank Pain    Krystal Morton is a 20 y.o. female.  Pt presents to the ED today with left sided flank pain.  Pt said she woke up around 1600 from a nap and had severe pain in the left side of her back.  It has moved around to her stomach.  She has had multiple episodes of vomiting.  She denies f/c.      Past Medical History:  Diagnosis Date   Benign tumor    IBS (irritable bowel syndrome)     There are no problems to display for this patient.   Past Surgical History:  Procedure Laterality Date   TONSILLECTOMY     TUMOR REMOVAL     WISDOM TOOTH EXTRACTION       OB History   No obstetric history on file.     No family history on file.  Social History   Tobacco Use   Smoking status: Never   Smokeless tobacco: Never  Vaping Use   Vaping Use: Never used  Substance Use Topics   Alcohol use: No   Drug use: No    Home Medications Prior to Admission medications   Medication Sig Start Date End Date Taking? Authorizing Provider  ondansetron (ZOFRAN ODT) 4 MG disintegrating tablet Take 1 tablet (4 mg total) by mouth every 8 (eight) hours as needed for nausea or vomiting. 09/17/20  Yes Isla Pence, MD  oxyCODONE-acetaminophen (PERCOCET/ROXICET) 5-325 MG tablet Take 1 tablet by mouth every 6 (six) hours as needed for severe pain. 09/17/20  Yes Isla Pence, MD  tamsulosin (FLOMAX) 0.4 MG CAPS capsule Take 1 capsule (0.4 mg total) by mouth daily. 09/17/20  Yes Isla Pence, MD  ketorolac (TORADOL) 10 MG tablet Take 1 tablet (10 mg total) by mouth every 6 (six) hours as needed. 12/24/19   Hall-Potvin, [place], PA-C  lactobacillus acidophilus & bulgar (LACTINEX) chewable tablet Chew 1 tablet by mouth 3 (three) times daily with meals. 03/18/14   Harden Mo, MD    Allergies     Dicyclomine  Review of Systems   Review of Systems  Gastrointestinal:  Positive for nausea and vomiting.  Genitourinary:  Positive for flank pain.  All other systems reviewed and are negative.  Physical Exam Updated Vital Signs BP 119/73 (BP Location: Right Arm)   Pulse 68   Temp 98.2 F (36.8 C) (Oral)   Resp 16   Ht '5\' 6"'$  (1.676 m)   Wt 90.7 kg   SpO2 98%   BMI 32.28 kg/m   Physical Exam Vitals and nursing note reviewed.  Constitutional:      General: She is in acute distress.     Appearance: Normal appearance.  HENT:     Head: Normocephalic and atraumatic.     Right Ear: External ear normal.     Left Ear: External ear normal.     Nose: Nose normal.     Mouth/Throat:     Mouth: Mucous membranes are dry.     Pharynx: Oropharynx is clear.  Eyes:     Extraocular Movements: Extraocular movements intact.     Conjunctiva/sclera: Conjunctivae normal.     Pupils: Pupils are equal, round, and reactive to light.  Cardiovascular:     Rate and Rhythm: Normal rate and regular rhythm.     Pulses: Normal  pulses.     Heart sounds: Normal heart sounds.  Pulmonary:     Effort: Pulmonary effort is normal.     Breath sounds: Normal breath sounds.  Abdominal:     General: Abdomen is flat. Bowel sounds are normal.     Palpations: Abdomen is soft.  Musculoskeletal:        General: Normal range of motion.     Cervical back: Normal range of motion and neck supple.  Skin:    General: Skin is warm.     Capillary Refill: Capillary refill takes less than 2 seconds.  Neurological:     General: No focal deficit present.     Mental Status: She is alert and oriented to person, place, and time.  Psychiatric:        Mood and Affect: Mood normal.        Behavior: Behavior normal.    ED Results / Procedures / Treatments   Labs (all labs ordered are listed, but only abnormal results are displayed) Labs Reviewed  URINALYSIS, ROUTINE W REFLEX MICROSCOPIC - Abnormal; Notable for the  following components:      Result Value   APPearance CLOUDY (*)    pH 9.0 (*)    Ketones, ur 80 (*)    Protein, ur 100 (*)    Leukocytes,Ua TRACE (*)    Bacteria, UA RARE (*)    All other components within normal limits  BASIC METABOLIC PANEL - Abnormal; Notable for the following components:   Glucose, Bld 140 (*)    Creatinine, Ser 1.08 (*)    All other components within normal limits  CBC - Abnormal; Notable for the following components:   WBC 13.3 (*)    RBC 5.39 (*)    Hemoglobin 16.2 (*)    HCT 47.2 (*)    Platelets 425 (*)    All other components within normal limits  I-STAT BETA HCG BLOOD, ED ([facility], [facility], [facility] ONLY)    EKG None  Radiology CT Renal Stone Study  Result Date: 09/17/2020 CLINICAL DATA:  Flank pain, kidney stone suspected. EXAM: CT ABDOMEN AND PELVIS WITHOUT CONTRAST TECHNIQUE: Multidetector CT imaging of the abdomen and pelvis was performed following the standard protocol without IV contrast. COMPARISON:  March 18, 2019 FINDINGS: Lower chest: No acute abnormality. Hepatobiliary: Unremarkable noncontrast appearance of the hepatic parenchyma. Gallbladder is unremarkable. No biliary ductal dilation. Pancreas: No evidence of acute pancreatic inflammation or pancreatic ductal dilation. Spleen: Within normal limits. Adrenals/Urinary Tract: Bilateral adrenal glands are unremarkable. Very mild left hydroureteronephrosis to the level of a 3 mm stone in the distal ureter just proximal to the UVJ. Previously visualized 3-4 mm left interpolar renal stone is no longer present on today's examination. Right kidneys unremarkable. Stomach/Bowel: Stomach is within normal limits. Appendix appears normal. No evidence of bowel wall thickening, distention, or inflammatory changes. Vascular/Lymphatic: No significant vascular findings are present. No enlarged abdominal or pelvic lymph nodes. Reproductive: Uterus and bilateral adnexa are unremarkable. Other: No abdominopelvic ascites.  Musculoskeletal: No acute or significant osseous findings. IMPRESSION: Very mild LEFT hydroureteronephrosis to the level of a 3 mm stone in the distal ureter just proximal to the UVJ. Electronically Signed   By: Dahlia Bailiff M.D.   On: 09/17/2020 21:03    Procedures Procedures   Medications Ordered in ED Medications  sodium chloride 0.9 % bolus 1,000 mL (0 mLs Intravenous Stopped 09/17/20 2148)  morphine 4 MG/ML injection 4 mg (4 mg Intravenous Given 09/17/20 2101)  ketorolac (TORADOL)  30 MG/ML injection 30 mg (30 mg Intravenous Given 09/17/20 2100)  ondansetron (ZOFRAN) injection 4 mg (4 mg Intravenous Given 09/17/20 2058)  HYDROmorphone (DILAUDID) injection 1 mg (1 mg Intravenous Given 09/17/20 2148)    ED Course  I have reviewed the triage vital signs and the nursing notes.  Pertinent labs & imaging results that were available during my care of the patient were reviewed by me and considered in my medical decision making (see chart for details).    MDM Rules/Calculators/A&P                           Pt is feeling better after meds.  She does have a 3 mm stone in the distal left ureter.  She is stable for d/c.  She is to return if worse.  F/u with urology. Final Clinical Impression(s) / ED Diagnoses Final diagnoses:  Kidney stone  Ureteral colic  Non-intractable vomiting with nausea, unspecified vomiting type  Dehydration    Rx / DC Orders ED Discharge Orders          Ordered    oxyCODONE-acetaminophen (PERCOCET/ROXICET) 5-325 MG tablet  Every 6 hours PRN        09/17/20 2136    ondansetron (ZOFRAN ODT) 4 MG disintegrating tablet  Every 8 hours PRN        09/17/20 2136    tamsulosin (FLOMAX) 0.4 MG CAPS capsule  Daily        09/17/20 2136             Isla Pence, MD 09/17/20 2248

## 2020-09-22 ENCOUNTER — Encounter (HOSPITAL_BASED_OUTPATIENT_CLINIC_OR_DEPARTMENT_OTHER): Payer: Self-pay | Admitting: Urology

## 2020-09-22 ENCOUNTER — Other Ambulatory Visit: Payer: Self-pay | Admitting: Urology

## 2020-09-22 DIAGNOSIS — R1084 Generalized abdominal pain: Secondary | ICD-10-CM | POA: Diagnosis not present

## 2020-09-22 DIAGNOSIS — R8271 Bacteriuria: Secondary | ICD-10-CM | POA: Diagnosis not present

## 2020-09-22 DIAGNOSIS — N201 Calculus of ureter: Secondary | ICD-10-CM | POA: Diagnosis not present

## 2020-09-24 ENCOUNTER — Ambulatory Visit (HOSPITAL_BASED_OUTPATIENT_CLINIC_OR_DEPARTMENT_OTHER): Admission: RE | Admit: 2020-09-24 | Payer: BC Managed Care – PPO | Source: Home / Self Care | Admitting: Urology

## 2020-09-24 HISTORY — DX: Irritable bowel syndrome with diarrhea: K58.0

## 2020-09-24 HISTORY — DX: Personal history of diseases of the skin and subcutaneous tissue: Z98.890

## 2020-09-24 HISTORY — DX: Calculus of ureter: N20.1

## 2020-09-24 HISTORY — DX: Other specified postprocedural states: Z87.2

## 2020-09-24 SURGERY — CYSTOSCOPY/URETEROSCOPY/HOLMIUM LASER/STENT PLACEMENT
Anesthesia: General | Laterality: Left

## 2020-11-27 DIAGNOSIS — N63 Unspecified lump in unspecified breast: Secondary | ICD-10-CM | POA: Diagnosis not present

## 2020-11-27 DIAGNOSIS — Z6830 Body mass index (BMI) 30.0-30.9, adult: Secondary | ICD-10-CM | POA: Diagnosis not present

## 2020-11-27 DIAGNOSIS — Z113 Encounter for screening for infections with a predominantly sexual mode of transmission: Secondary | ICD-10-CM | POA: Diagnosis not present

## 2020-12-03 ENCOUNTER — Other Ambulatory Visit: Payer: Self-pay | Admitting: Obstetrics and Gynecology

## 2020-12-03 DIAGNOSIS — N63 Unspecified lump in unspecified breast: Secondary | ICD-10-CM

## 2020-12-11 ENCOUNTER — Other Ambulatory Visit: Payer: Self-pay

## 2020-12-11 ENCOUNTER — Ambulatory Visit (INDEPENDENT_AMBULATORY_CARE_PROVIDER_SITE_OTHER): Payer: BC Managed Care – PPO | Admitting: Nurse Practitioner

## 2020-12-11 ENCOUNTER — Encounter: Payer: Self-pay | Admitting: Nurse Practitioner

## 2020-12-11 VITALS — BP 123/70 | HR 83 | Temp 98.0°F | Ht 66.0 in | Wt 189.7 lb

## 2020-12-11 DIAGNOSIS — R079 Chest pain, unspecified: Secondary | ICD-10-CM | POA: Insufficient documentation

## 2020-12-11 DIAGNOSIS — Z6830 Body mass index (BMI) 30.0-30.9, adult: Secondary | ICD-10-CM

## 2020-12-11 DIAGNOSIS — Z6834 Body mass index (BMI) 34.0-34.9, adult: Secondary | ICD-10-CM | POA: Insufficient documentation

## 2020-12-11 DIAGNOSIS — M546 Pain in thoracic spine: Secondary | ICD-10-CM

## 2020-12-11 DIAGNOSIS — M545 Low back pain, unspecified: Secondary | ICD-10-CM | POA: Insufficient documentation

## 2020-12-11 DIAGNOSIS — Z7689 Persons encountering health services in other specified circumstances: Secondary | ICD-10-CM

## 2020-12-11 DIAGNOSIS — E782 Mixed hyperlipidemia: Secondary | ICD-10-CM | POA: Insufficient documentation

## 2020-12-11 NOTE — Patient Instructions (Addendum)
Exercising to Stay Healthy To become healthy and stay healthy, it is recommended that you do moderate-intensity and vigorous-intensity exercise. You can tell that you are exercising at a moderate intensity if your heart starts beating faster and you start breathing faster but can still hold a conversation. You can tell that you are exercising at a vigorous intensity if you are breathing much harder and faster and cannot hold a conversation while exercising. How can exercise benefit me? Exercising regularly is important. It has many health benefits, such as: Improving overall fitness, flexibility, and endurance. Increasing bone density. Helping with weight control. Decreasing body fat. Increasing muscle strength and endurance. Reducing stress and tension, anxiety, depression, or anger. Improving overall health. What guidelines should I follow while exercising? Before you start a new exercise program, talk with your health care provider. Do not exercise so much that you hurt yourself, feel dizzy, or get very short of breath. Wear comfortable clothes and wear shoes with good support. Drink plenty of water while you exercise to prevent dehydration or heat stroke. Work out until your breathing and your heartbeat get faster (moderate intensity). How often should I exercise? Choose an activity that you enjoy, and set realistic goals. Your health care provider can help you make an activity plan that is individually designed and works best for you. Exercise regularly as told by your health care provider. This may include: Doing strength training two times a week, such as: Lifting weights. Using resistance bands. Push-ups. Sit-ups. Yoga. Doing a certain intensity of exercise for a given amount of time. Choose from these options: A total of 150 minutes of moderate-intensity exercise every week. A total of 75 minutes of vigorous-intensity exercise every week. A mix of moderate-intensity and  vigorous-intensity exercise every week. Children, pregnant women, people who have not exercised regularly, people who are overweight, and older adults may need to talk with a health care provider about what activities are safe to perform. If you have a medical condition, be sure to talk with your health care provider before you start a new exercise program. What are some exercise ideas? Moderate-intensity exercise ideas include: Walking 1 mile (1.6 km) in about 15 minutes. Biking. Hiking. Golfing. Dancing. Water aerobics. Vigorous-intensity exercise ideas include: Walking 4.5 miles (7.2 km) or more in about 1 hour. Jogging or running 5 miles (8 km) in about 1 hour. Biking 10 miles (16.1 km) or more in about 1 hour. Lap swimming. Roller-skating or in-line skating. Cross-country skiing. Vigorous competitive sports, such as football, basketball, and soccer. Jumping rope. Aerobic dancing. What are some everyday activities that can help me get exercise? Yard work, such as: Pushing a lawn mower. Raking and bagging leaves. Washing your car. Pushing a stroller. Shoveling snow. Gardening. Washing windows or floors. How can I be more active in my day-to-day activities? Use stairs instead of an elevator. Take a walk during your lunch break. If you drive, park your car farther away from your work or school. If you take public transportation, get off one stop early and walk the rest of the way. Stand up or walk around during all of your indoor phone calls. Get up, stretch, and walk around every 30 minutes throughout the day. Enjoy exercise with a friend. Support to continue exercising will help you keep a regular routine of activity. Where to find more information You can find more information about exercising to stay healthy from: U.S. Department of Health and Human Services: www.hhs.gov Centers for Disease Control and Prevention (  CDC): http://www.wolf.info/ Summary Exercising regularly is  important. It will improve your overall fitness, flexibility, and endurance. Regular exercise will also improve your overall health. It can help you control your weight, reduce stress, and improve your bone density. Do not exercise so much that you hurt yourself, feel dizzy, or get very short of breath. Before you start a new exercise program, talk with your health care provider. This information is not intended to replace advice given to you by your health care provider. Make sure you discuss any questions you have with your health care provider. Document Revised: 04/25/2020 Document Reviewed: 04/25/2020 Elsevier Patient Education  2022 [place] and Cholesterol Restricted Eating Plan Getting too much fat and cholesterol in your diet may cause health problems. Choosing the right foods helps keep your fat and cholesterol at normal levels. This can keep you from getting certain diseases. Your doctor may recommend an eating plan that includes: Total fat: ______% or less of total calories a day. This is ______g of fat a day. Saturated fat: ______% or less of total calories a day. This is ______g of saturated fat a day. Cholesterol: less than _________mg a day. Fiber: ______g a day. What are tips for following this plan? General tips Work with your doctor to lose weight if you need to. Avoid: Foods with added sugar. Fried foods. Foods with trans fat or partially hydrogenated oils. This includes some margarines and baked goods. If you drink alcohol: Limit how much you have to: 0-1 drink a day for women who are not pregnant. 0-2 drinks a day for men. Know how much alcohol is in a drink. In the [place] one drink equals one 12 oz bottle of beer (355 mL), one 5 oz glass of wine (148 mL), or one 1 oz glass of hard liquor (44 mL). Reading food labels Check food labels for: Trans fats. Partially hydrogenated oils. Saturated fat (g) in each serving. Cholesterol (mg) in each  serving. Fiber (g) in each serving. Choose foods with healthy fats, such as: Monounsaturated fats and polyunsaturated fats. These include olive and canola oil, flaxseeds, walnuts, almonds, and seeds. Omega-3 fats. These are found in certain fish, flaxseed oil, and ground flaxseeds. Choose grain products that have whole grains. Look for the word "whole" as the first word in the ingredient list. Cooking Cook foods using low-fat methods. These include baking, boiling, grilling, and broiling. Eat more home-cooked foods. Eat at restaurants and buffets less often. Eat less fast food. Avoid cooking using saturated fats, such as butter, cream, palm oil, palm kernel oil, and coconut oil. Meal planning  At meals, divide your plate into four equal parts: Fill one-half of your plate with vegetables, green salads, and fruit. Fill one-fourth of your plate with whole grains. Fill one-fourth of your plate with low-fat (lean) protein foods. Eat fish that is high in omega-3 fats at least two times a week. This includes mackerel, tuna, sardines, and salmon. Eat foods that are high in fiber, such as whole grains, beans, apples, pears, berries, broccoli, carrots, peas, and barley. What foods should I eat? Fruits All fresh, canned (in natural juice), or frozen fruits. Vegetables Fresh or frozen vegetables (raw, steamed, roasted, or grilled). Green salads. Grains Whole grains, such as whole wheat or whole grain breads, crackers, cereals, and pasta. Unsweetened oatmeal, bulgur, barley, quinoa, or brown rice. Corn or whole wheat flour tortillas. Meats and other protein foods Ground beef (85% or leaner), grass-fed beef, or beef trimmed of fat. Skinless  chicken or [place]. Ground chicken or [place]. Pork trimmed of fat. All fish and seafood. Egg whites. Dried beans, peas, or lentils. Unsalted nuts or seeds. Unsalted canned beans. Nut butters without added sugar or oil. Dairy Low-fat or nonfat dairy products, such as  skim or 1% milk, 2% or reduced-fat cheeses, low-fat and fat-free ricotta or cottage cheese, or plain low-fat and nonfat yogurt. Fats and oils Tub margarine without trans fats. Light or reduced-fat mayonnaise and salad dressings. Avocado. Olive, canola, sesame, or safflower oils. The items listed above may not be a complete list of foods and beverages you can eat. Contact a dietitian for more information. What foods should I avoid? Fruits Canned fruit in heavy syrup. Fruit in cream or butter sauce. Fried fruit. Vegetables Vegetables cooked in cheese, cream, or butter sauce. Fried vegetables. Grains White bread. White pasta. White rice. Cornbread. Bagels, pastries, and croissants. Crackers and snack foods that contain trans fat and hydrogenated oils. Meats and other protein foods Fatty cuts of meat. Ribs, chicken wings, bacon, sausage, bologna, salami, chitterlings, fatback, hot dogs, bratwurst, and packaged lunch meats. Liver and organ meats. Whole eggs and egg yolks. Chicken and [place] with skin. Fried meat. Dairy Whole or 2% milk, cream, half-and-half, and cream cheese. Whole milk cheeses. Whole-fat or sweetened yogurt. Full-fat cheeses. Nondairy creamers and whipped toppings. Processed cheese, cheese spreads, and cheese curds. Fats and oils Butter, stick margarine, lard, shortening, ghee, or bacon fat. Coconut, palm kernel, and palm oils. Beverages Alcohol. Sugar-sweetened drinks such as sodas, lemonade, and fruit drinks. Sweets and desserts Corn syrup, sugars, honey, and molasses. Candy. Jam and jelly. Syrup. Sweetened cereals. Cookies, pies, cakes, donuts, muffins, and ice cream. The items listed above may not be a complete list of foods and beverages you should avoid. Contact a dietitian for more information. Summary Choosing the right foods helps keep your fat and cholesterol at normal levels. This can keep you from getting certain diseases. At meals, fill one-half of your plate with  vegetables, green salads, and fruits. Eat high fiber foods, like whole grains, beans, apples, pears, berries, carrots, peas, and barley. Limit added sugar, saturated fats, alcohol, and fried foods. This information is not intended to replace advice given to you by your health care provider. Make sure you discuss any questions you have with your health care provider. Document Revised: 05/09/2020 Document Reviewed: 05/09/2020 Elsevier Patient Education  [place].

## 2020-12-11 NOTE — Progress Notes (Signed)
New Patient Office Visit  Subjective:  Patient ID: Krystal Morton, female    DOB: 12-Aug-2000  Age: 20 y.o. MRN: [id number]  CC:  Chief Complaint  Patient presents with   New Patient (Initial Visit)    HPI Krystal Morton presents to establish new primary care provider. She is establishing care closer to her home. Has been seeing [facility]. Has history of hyperlipidemia. Has stopped eating fast food as frequently as she had been. Has not had labs rechecked since the first diagnosis which was about one year ago. She does have GYN provider she sees for well woman care and birth control prescriptions.  Patient states that she has a "squeezing" or "pinching" type pain in the back of her heart. This does not feel muscular in nature. She states that this started a few weeks ago. This has also happened a few times while trying to sleep.  She feels like this may be related to stress or work. 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  She denies shortness of breath associated with this feeling.  States that she also has bad back pain. This has had this been going on for years. Work makes this worse. This is mid to lower back.   Past Medical History:  Diagnosis Date   Benign tumor    History of surgical removal of lesion    right ilium pelvis/ bone tumor;  s/p excision removal 10-04-2015 benign osteochondroma   Irritable bowel syndrome with diarrhea    Left ureteral stone     Past Surgical History:  Procedure Laterality Date   BONE TUMOR EXCISION  10/04/2015   @[facility] ;   right pelvis  osteochondroma   TONSILLECTOMY  2010   TUMOR REMOVAL     WISDOM TOOTH EXTRACTION      Family History  Problem Relation Age of Onset   High blood pressure Mother    High blood pressure Maternal Grandmother    Colon cancer Maternal Grandmother     Social History   Socioeconomic History   Marital status: Single    Spouse name: Not on file   Number of children: Not on file    Years of education: Not on file   Highest education level: Not on file  Occupational History   Not on file  Tobacco Use   Smoking status: Never   Smokeless tobacco: Never  Vaping Use   Vaping Use: Never used  Substance and Sexual Activity   Alcohol use: No   Drug use: No   Sexual activity: Yes    Birth control/protection: Pill  Other Topics Concern   Not on file  Social History Narrative   Not on file   Social Determinants of Health   Financial Resource Strain: Not on file  Food Insecurity: Not on file  Transportation Needs: Not on file  Physical Activity: Not on file  Stress: Not on file  Social Connections: Not on file  Intimate Partner Violence: Not on file    ROS Review of Systems  Constitutional:  Negative for activity change, appetite change, chills, fatigue and fever.  HENT:  Negative for congestion, postnasal drip, rhinorrhea, sinus pressure, sinus pain, sneezing and sore throat.   Eyes: Negative.   Respiratory:  Positive for chest tightness and shortness of breath. Negative for cough and wheezing.   Cardiovascular:  Positive for chest pain. Negative for palpitations.  Gastrointestinal:  Negative for abdominal pain, constipation, diarrhea, nausea and vomiting.  Endocrine: Negative for  cold intolerance, heat intolerance, polydipsia and polyuria.  Genitourinary:  Negative for dyspareunia, dysuria, flank pain, frequency and urgency.  Musculoskeletal:  Positive for back pain and myalgias. Negative for arthralgias.  Skin:  Negative for rash.  Allergic/Immunologic: Negative for environmental allergies.  Neurological:  Negative for dizziness, weakness and headaches.  Hematological:  Negative for adenopathy.  Psychiatric/Behavioral:  The patient is nervous/anxious.    Objective:   Today's Vitals   12/11/20 1129  BP: 123/70  Pulse: 83  Temp: 98 F (36.7 C)  SpO2: 100%  Weight: 189 lb 11.2 oz (86 kg)  Height: 5\' 6"  (1.676 m)   Body mass index is 30.62 kg/m.    Physical Exam Vitals and nursing note reviewed.  Constitutional:      Appearance: Normal appearance. She is well-developed. She is obese.  HENT:     Head: Normocephalic and atraumatic.  Eyes:     Pupils: Pupils are equal, round, and reactive to light.  Cardiovascular:     Rate and Rhythm: Normal rate and regular rhythm.     Pulses: Normal pulses.     Heart sounds: Normal heart sounds.     Comments: Borderline ECG today with normal sinus rhythm and rightward axis  Pulmonary:     Effort: Pulmonary effort is normal.     Breath sounds: Normal breath sounds.  Abdominal:     Palpations: Abdomen is soft.  Musculoskeletal:        General: Normal range of motion.       Arms:     Cervical back: Normal range of motion and neck supple.  Lymphadenopathy:     Cervical: No cervical adenopathy.  Skin:    General: Skin is warm and dry.     Capillary Refill: Capillary refill takes less than 2 seconds.  Neurological:     General: No focal deficit present.     Mental Status: She is alert and oriented to person, place, and time.  Psychiatric:        Mood and Affect: Mood normal.        Behavior: Behavior normal.        Thought Content: Thought content normal.        Judgment: Judgment normal.    Assessment & Plan:  1. Encounter to establish care Appointment today to establish new primary care provider.   2. Chest pain, unspecified type Likely related to stress or anxiety.  Borderline EKG today with normal sinus rhythm and rightward axis.  Will monitor mostly.  3. Thoracolumbar back pain Will x-ray thoracic and lumbar spine for further evaluation.  Refer to orthopedics as indicated. - DG Lumbar Spine 2-3 Views; Future - DG Thoracic Spine 2 View; Future  4. Body mass index (BMI) of 30.0-30.9 in adult Discussed lowering calorie intake to 1500 calories per day and incorporating exercise into daily routine to help lose weight. Will monitor.   5. Mixed hyperlipidemia Check fasting  lipid panel and treat as indicated.  Problem List Items Addressed This Visit       Other   Chest pain   Relevant Orders   EKG 12-Lead   Thoracolumbar back pain   Relevant Orders   DG Lumbar Spine 2-3 Views   DG Thoracic Spine 2 View   Body mass index (BMI) of 30.0-30.9 in adult   Mixed hyperlipidemia   Other Visit Diagnoses     Encounter to establish care    -  Primary       Outpatient Encounter Medications as  of 12/11/2020  Medication Sig   ketorolac (TORADOL) 10 MG tablet Take 1 tablet (10 mg total) by mouth every 6 (six) hours as needed. (Patient not taking: Reported on 12/11/2020)   lactobacillus acidophilus & bulgar (LACTINEX) chewable tablet Chew 1 tablet by mouth 3 (three) times daily with meals. (Patient not taking: Reported on 12/11/2020)   ondansetron (ZOFRAN ODT) 4 MG disintegrating tablet Take 1 tablet (4 mg total) by mouth every 8 (eight) hours as needed for nausea or vomiting. (Patient not taking: Reported on 12/11/2020)   oxyCODONE-acetaminophen (PERCOCET/ROXICET) 5-325 MG tablet Take 1 tablet by mouth every 6 (six) hours as needed for severe pain.   tamsulosin (FLOMAX) 0.4 MG CAPS capsule Take 1 capsule (0.4 mg total) by mouth daily.   VIENVA 0.1-20 MG-MCG tablet Take 1 tablet by mouth daily.   No facility-administered encounter medications on file as of 12/11/2020.    Follow-up: Return in about 6 months (around 06/11/2021) for health maintenance exam - FBW with free t4 and vitamin d  in next few days .   Ronnell Freshwater, NP

## 2020-12-22 ENCOUNTER — Other Ambulatory Visit: Payer: Self-pay | Admitting: Nurse Practitioner

## 2020-12-22 DIAGNOSIS — Z0000 Encounter for general adult medical examination without abnormal findings: Secondary | ICD-10-CM

## 2020-12-22 DIAGNOSIS — E782 Mixed hyperlipidemia: Secondary | ICD-10-CM

## 2020-12-22 DIAGNOSIS — Z6830 Body mass index (BMI) 30.0-30.9, adult: Secondary | ICD-10-CM

## 2020-12-23 ENCOUNTER — Other Ambulatory Visit: Payer: BC Managed Care – PPO

## 2020-12-23 ENCOUNTER — Other Ambulatory Visit: Payer: Self-pay

## 2020-12-23 DIAGNOSIS — E782 Mixed hyperlipidemia: Secondary | ICD-10-CM | POA: Diagnosis not present

## 2020-12-23 DIAGNOSIS — Z6830 Body mass index (BMI) 30.0-30.9, adult: Secondary | ICD-10-CM

## 2020-12-23 DIAGNOSIS — Z0000 Encounter for general adult medical examination without abnormal findings: Secondary | ICD-10-CM | POA: Diagnosis not present

## 2020-12-24 LAB — COMPREHENSIVE METABOLIC PANEL
ALT: 18 IU/L (ref 0–32)
AST: 18 IU/L (ref 0–40)
Albumin/Globulin Ratio: 1.8 (ref 1.2–2.2)
Albumin: 4.5 g/dL (ref 3.9–5.0)
Alkaline Phosphatase: 97 IU/L (ref 44–121)
BUN/Creatinine Ratio: 15 (ref 9–23)
BUN: 11 mg/dL (ref 6–20)
Bilirubin Total: 0.3 mg/dL (ref 0.0–1.2)
CO2: 21 mmol/L (ref 20–29)
Calcium: 10 mg/dL (ref 8.7–10.2)
Chloride: 100 mmol/L (ref 96–106)
Creatinine, Ser: 0.73 mg/dL (ref 0.57–1.00)
Globulin, Total: 2.5 g/dL (ref 1.5–4.5)
Glucose: 69 mg/dL — ABNORMAL LOW (ref 70–99)
Potassium: 4 mmol/L (ref 3.5–5.2)
Sodium: 138 mmol/L (ref 134–144)
Total Protein: 7 g/dL (ref 6.0–8.5)
eGFR: 119 mL/min/{1.73_m2} (ref >59)

## 2020-12-24 LAB — LIPID PANEL
Chol/HDL Ratio: 6 ratio — ABNORMAL HIGH (ref 0.0–4.4)
Cholesterol, Total: 258 mg/dL — ABNORMAL HIGH (ref 100–199)
HDL: 43 mg/dL (ref >39)
LDL Chol Calc (NIH): 201 mg/dL — ABNORMAL HIGH (ref 0–99)
Triglycerides: 82 mg/dL (ref 0–149)
VLDL Cholesterol Cal: 14 mg/dL (ref 5–40)

## 2020-12-24 LAB — CBC
Hematocrit: 40.4 % (ref 34.0–46.6)
Hemoglobin: 14 g/dL (ref 11.1–15.9)
MCH: 30 pg (ref 26.6–33.0)
MCHC: 34.7 g/dL (ref 31.5–35.7)
MCV: 87 fL (ref 79–97)
Platelets: 330 10*3/uL (ref 150–450)
RBC: 4.66 x10E6/uL (ref 3.77–5.28)
RDW: 12 % (ref 11.7–15.4)
WBC: 10 10*3/uL (ref 3.4–10.8)

## 2020-12-24 LAB — TSH: TSH: 3.04 u[IU]/mL (ref 0.450–4.500)

## 2020-12-24 LAB — HEMOGLOBIN A1C
Est. average glucose Bld gHb Est-mCnc: 97 mg/dL
Hgb A1c MFr Bld: 5 % (ref 4.8–5.6)

## 2020-12-29 ENCOUNTER — Encounter: Payer: Self-pay | Admitting: Nurse Practitioner

## 2020-12-29 NOTE — Progress Notes (Signed)
Mychart message sent to patient regarding labs - otal cholesterol were both a little elevated, increasing your risk for developing cardiovascular disease in the future. I recmmend that you try to limit intake of fried and fatty foods and increase physical activity. We will recheck this next year. Also, your glucose was just a tiny bit low. That is likely because you were fasting. Again, we can repeat this next year.

## 2021-01-06 ENCOUNTER — Ambulatory Visit
Admission: RE | Admit: 2021-01-06 | Discharge: 2021-01-06 | Disposition: A | Discharge status: Home / Self Care | Payer: BC Managed Care – PPO | Source: Ambulatory Visit | Attending: Obstetrics and Gynecology | Admitting: Obstetrics and Gynecology

## 2021-01-06 DIAGNOSIS — N6311 Unspecified lump in the right breast, upper outer quadrant: Secondary | ICD-10-CM | POA: Diagnosis not present

## 2021-01-06 DIAGNOSIS — N63 Unspecified lump in unspecified breast: Secondary | ICD-10-CM

## 2021-06-19 ENCOUNTER — Ambulatory Visit
Admission: EM | Admit: 2021-06-19 | Discharge: 2021-06-19 | Disposition: A | Discharge status: Home / Self Care | Payer: Managed Care, Other (non HMO) | Attending: Emergency Medicine | Admitting: Emergency Medicine

## 2021-06-19 DIAGNOSIS — J029 Acute pharyngitis, unspecified: Secondary | ICD-10-CM | POA: Insufficient documentation

## 2021-06-19 LAB — POCT RAPID STREP A (OFFICE): Rapid Strep A Screen: NEGATIVE

## 2021-06-19 MED ORDER — IBUPROFEN 600 MG PO TABS: 600.0000 mg | ORAL_TABLET | Freq: Four times a day (QID) | ORAL | 0 refills | Status: DC | PRN

## 2021-06-19 NOTE — ED Provider Notes (Signed)
HPI  SUBJECTIVE:  Patient reports sore throat starting yesterday. Sx worse with swallowing, talking.  Sx better with nothing. Has been taking Zyrtec, soup w/ o relief.  No fever   No neck stiffness  No Cough + nasal congestion, no rhinorrhea No Myalgias No Headache No Rash  No loss of taste or smell No shortness of breath or difficulty breathing No nausea, vomiting No diarrhea No abdominal pain     No Recent Strep, mono, COVID exposure She got 2 doses of the COVID-vaccine No reflux sxs No Allergy sxs  No Breathing difficulty, voice changes, sensation of throat swelling shut No Drooling No Trismus No abx in past month.  No antipyretic in past 4-6 hrs  Past medical history of IBS LMP: 1 week ago.  Denies possibility of being pregnant PCP:  [facility]  Past Medical History:  Diagnosis Date   Benign tumor    History of surgical removal of lesion    right ilium pelvis/ bone tumor;  s/p excision removal 10-04-2015 benign osteochondroma   Irritable bowel syndrome with diarrhea    Left ureteral stone     Past Surgical History:  Procedure Laterality Date   BONE TUMOR EXCISION  10/04/2015   '@[facility]'$ ;   right pelvis  osteochondroma   TONSILLECTOMY  2010   TUMOR REMOVAL     WISDOM TOOTH EXTRACTION      Family History  Problem Relation Age of Onset   High blood pressure Mother    High blood pressure Maternal Grandmother    Colon cancer Maternal Grandmother     Social History   Tobacco Use   Smoking status: Never   Smokeless tobacco: Never  Vaping Use   Vaping Use: Never used  Substance Use Topics   Alcohol use: No   Drug use: No    No current facility-administered medications for this encounter.  Current Outpatient Medications:    ibuprofen (ADVIL) 600 MG tablet, Take 1 tablet (600 mg total) by mouth every 6 (six) hours as needed., Disp: 30 tablet, Rfl: 0   VIENVA 0.1-20 MG-MCG tablet, Take 1 tablet by mouth daily., Disp: , Rfl:   Allergies   Allergen Reactions   Dicyclomine Other (See Comments)    Blurred vision, dizziness      ROS  As noted in HPI.   Physical Exam  BP 129/80 (BP Location: Right Arm)   Pulse 68   Temp 97.8 F (36.6 C) (Oral)   Resp 18   SpO2 98%   Constitutional: Well developed, well nourished, no acute distress Eyes:  EOMI, conjunctiva normal bilaterally HENT: Normocephalic, atraumatic,mucus membranes moist.  No nasal congestion.  Tonsils surgically absent.  Erythematous oropharynx.  No petechiae on palate.  No postnasal drip, cobblestoning.  No drooling, trismus, neck stiffness. Respiratory: Normal inspiratory effort Cardiovascular: Normal rate, no murmurs, rubs, gallops GI: nondistended, nontender. No appreciable splenomegaly skin: No rash, skin intact Lymph: Positive anterior cervical LN.  No posterior cervical lymphadenopathy Musculoskeletal: no deformities Neurologic: Alert & oriented x 3, no focal neuro deficits Psychiatric: Speech and behavior appropriate.   ED Course   Medications - No data to display  Orders Placed This Encounter  Procedures   Culture, group A strep    Standing Status:   Standing    Number of Occurrences:   1   POCT rapid strep A    Standing Status:   Standing    Number of Occurrences:   1    Results for orders placed or performed  during the hospital encounter of 06/19/21 (from the past 24 hour(s))  POCT rapid strep A     Status: None   Collection Time: 06/19/21  2:06 PM  Result Value Ref Range   Rapid Strep A Screen Negative Negative   No results found.  ED Clinical Impression  1. Sore throat      ED Assessment/Plan  Rapid strep negative. Obtaining throat culture to guide antibiotic treatment. Discussed this with patient. We'll contact them if culture is positive, and will call in Appropriate antibiotics. Patient home with ibuprofen, Tylenol, Benadryl/Maalox mixture.. Patient to followup with PCP when necessary.   Discussed labs,  MDM, plan  and followup with patient. Discussed sn/sx that should prompt return to the ED. patient agrees with plan.   Meds ordered this encounter  Medications   ibuprofen (ADVIL) 600 MG tablet    Sig: Take 1 tablet (600 mg total) by mouth every 6 (six) hours as needed.    Dispense:  30 tablet    Refill:  0     *This clinic note was created using [employer]. Therefore, there may be occasional mistakes despite careful proofreading.     Melynda Ripple, MD 06/20/21 [phone]

## 2021-06-19 NOTE — Discharge Instructions (Addendum)
your rapid strep was negative today, so we have sent off a throat culture.  We will contact you and call in the appropriate antibiotics if your culture comes back positive for an infection requiring antibiotic treatment.  Give [place] a working phone number.  I 1 gram of Tylenol and 600 mg ibuprofen together 3-4 times a day as needed for pain.  Make sure you drink plenty of extra fluids.  Some people find salt water gargles and  Tr[employer] Medic[employer]'s "Throat Coat" tea helpful. Take 5 mL of liquid Benadryl and 5 mL of Maalox. Mix it together, and then hold it in your mouth for as long as you can and then swallow. You may do this 4 times a day.    Go to www.goodrx.com  or www.costplusdrugs.com to look up your medications. This will give you a list of where you can find your prescriptions at the most affordable prices. Or ask the pharmacist what the cash price is, or if they have any other discount programs available to help make your medication more affordable. This can be less expensive than what you would pay with insurance.

## 2021-06-19 NOTE — ED Triage Notes (Signed)
Patient presents to Urgent Care with complaints of sore throat since yesterday. Patient reports zyrtec and warm fluids with minimal improvement.

## 2021-06-22 LAB — CULTURE, GROUP A STREP (THRC)

## 2021-08-16 ENCOUNTER — Encounter: Payer: Self-pay | Admitting: Emergency Medicine

## 2021-08-16 ENCOUNTER — Ambulatory Visit: Payer: Managed Care, Other (non HMO)

## 2021-08-16 ENCOUNTER — Ambulatory Visit
Admission: EM | Admit: 2021-08-16 | Discharge: 2021-08-16 | Disposition: A | Discharge status: Home / Self Care | Payer: Managed Care, Other (non HMO)

## 2021-08-16 DIAGNOSIS — M25572 Pain in left ankle and joints of left foot: Secondary | ICD-10-CM | POA: Diagnosis not present

## 2021-08-16 DIAGNOSIS — S99912A Unspecified injury of left ankle, initial encounter: Secondary | ICD-10-CM

## 2021-08-16 NOTE — ED Triage Notes (Signed)
PAtient presents to [facility] fo re vauation of less than 24 hours of left ankle pain after she stepped off a curb and twisted it inward.  Pain even when not bearing weight.  AMbulatory to room

## 2021-08-16 NOTE — ED Provider Notes (Signed)
[facility]    CSN: [id number] Arrival date & time: 08/16/21  0807      History   Chief Complaint Chief Complaint  Patient presents with   Ankle Pain    HPI Krystal Morton is a 21 y.o. female.   Patient here today for evaluation of left ankle injury that occurred yesterday.  She states that she accidentally inverted her ankle while stepping off a curb.  She has pain constantly and is worse with weightbearing.  She does not have any numbness or tingling.  She has not taken any medication for symptoms.  She denies any known injury to ankle in the past.  The history is provided by the patient.    Past Medical History:  Diagnosis Date   Benign tumor    History of surgical removal of lesion    right ilium pelvis/ bone tumor;  s/p excision removal 10-04-2015 benign osteochondroma   Irritable bowel syndrome with diarrhea    Left ureteral stone     Patient Active Problem List   Diagnosis Date Noted   Chest pain 12/11/2020   Thoracolumbar back pain 12/11/2020   Body mass index (BMI) of 30.0-30.9 in adult 12/11/2020   Mixed hyperlipidemia 12/11/2020    Past Surgical History:  Procedure Laterality Date   BONE TUMOR EXCISION  10/04/2015   '@[facility]'$ ;   right pelvis  osteochondroma   TONSILLECTOMY  2010   TUMOR REMOVAL     WISDOM TOOTH EXTRACTION      OB History   No obstetric history on file.      Home Medications    Prior to Admission medications   Medication Sig Start Date End Date Taking? Authorizing Provider  FLUoxetine (PROZAC) 10 MG tablet Take 10 mg by mouth daily.   Yes [provider]  ibuprofen (ADVIL) 600 MG tablet Take 1 tablet (600 mg total) by mouth every 6 (six) hours as needed. 06/19/21   Melynda Ripple, MD  VIENVA 0.1-20 MG-MCG tablet Take 1 tablet by mouth daily. 10/05/20   [provider]    Family History Family History  Problem Relation Age of Onset   Hypertension Mother    High blood pressure Mother     Arthritis/Rheumatoid Mother    High blood pressure Maternal Grandmother    Colon cancer Maternal Grandmother     Social History Social History   Tobacco Use   Smoking status: Never   Smokeless tobacco: Never  Vaping Use   Vaping Use: Never used  Substance Use Topics   Alcohol use: No   Drug use: No     Allergies   Dicyclomine   Review of Systems Review of Systems  Constitutional:  Negative for chills and fever.  Eyes:  Negative for discharge and redness.  Gastrointestinal:  Negative for abdominal pain, nausea and vomiting.  Musculoskeletal:  Positive for arthralgias and joint swelling.  Neurological:  Negative for numbness.     Physical Exam Triage Vital Signs ED Triage Vitals  Enc Vitals Group     BP      Pulse      Resp      Temp      Temp src      SpO2      Weight      Height      Head Circumference      Peak Flow      Pain Score      Pain Loc      Pain Edu?  Excl. in [place]?    No data found.  Updated Vital Signs BP 129/83 (BP Location: Left Arm)   Pulse 72   Temp 98.2 F (36.8 C) (Oral)   Resp 18   LMP 07/12/2021 (Approximate)   SpO2 98%   Physical Exam Vitals and nursing note reviewed.  Constitutional:      General: She is not in acute distress.    Appearance: Normal appearance. She is not ill-appearing.  HENT:     Head: Normocephalic and atraumatic.  Eyes:     Conjunctiva/sclera: Conjunctivae normal.  Cardiovascular:     Rate and Rhythm: Normal rate.  Pulmonary:     Effort: Pulmonary effort is normal.  Musculoskeletal:     Comments: Minimal diffuse swelling to lateral malleolus of left ankle.  Mild decreased rotation of the left ankle due to pain  Skin:    Findings: No bruising or erythema.  Neurological:     Mental Status: She is alert.     Comments: Gross sensation to left toes intact  Psychiatric:        Mood and Affect: Mood normal.        Behavior: Behavior normal.        Thought Content: Thought content normal.       UC Treatments / Results  Labs (all labs ordered are listed, but only abnormal results are displayed) Labs Reviewed - No data to display  EKG   Radiology DG Ankle Complete Left  Result Date: 08/16/2021 CLINICAL DATA:  Ankle pain following twisting injury. EXAM: LEFT ANKLE COMPLETE - 3+ VIEW COMPARISON:  None Available. FINDINGS: The mineralization and alignment are normal. There is no evidence of acute fracture or dislocation. The joint spaces appear preserved. Incidental bone island noted within the calcaneus. No focal soft tissue abnormality identified. IMPRESSION: No evidence of acute fracture or dislocation. Electronically Signed   By: Richardean Sale M.D.   On: 08/16/2021 09:06    Procedures Procedures (including critical care time)  Medications Ordered in UC Medications - No data to display  Initial Impression / Assessment and Plan / UC Course  I have reviewed the triage vital signs and the nursing notes.  Pertinent labs & imaging results that were available during my care of the patient were reviewed by me and considered in my medical decision making (see chart for details).    X-ray without fracture.  Suspect likely ankle sprain.  Recommended RICE therapy, ibuprofen and encouraged follow-up with Ortho if symptoms do not improve over the next 2 weeks.  Final Clinical Impressions(s) / UC Diagnoses   Final diagnoses:  Injury of left ankle, initial encounter   Discharge Instructions   None    ED Prescriptions   None    PDMP not reviewed this encounter.   Francene Finders, PA-C 08/16/21 [phone]

## 2021-09-18 IMAGING — CT CT RENAL STONE PROTOCOL
2 of 4 series · 17 of 46 positions shown, 19 images · non-contrast
Comparison: March 18, 2019

CLINICAL DATA: Flank pain, kidney stone suspected.

EXAM:
CT ABDOMEN AND PELVIS WITHOUT CONTRAST
TECHNIQUE: Multidetector CT imaging of the abdomen and pelvis was performed
following the standard protocol without IV contrast.

[Series 2: axial st · axial · 0.81mm/px · z∈[-466,-81]mm · 14 of 89 slices shown, 16 images]
[im 6/89  soft-tissue]
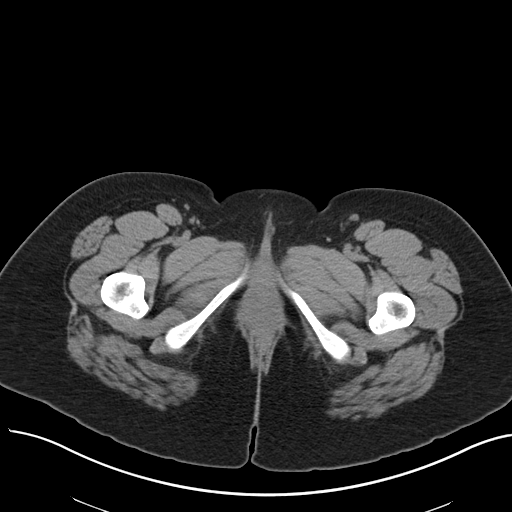
[im 6/89  bone]
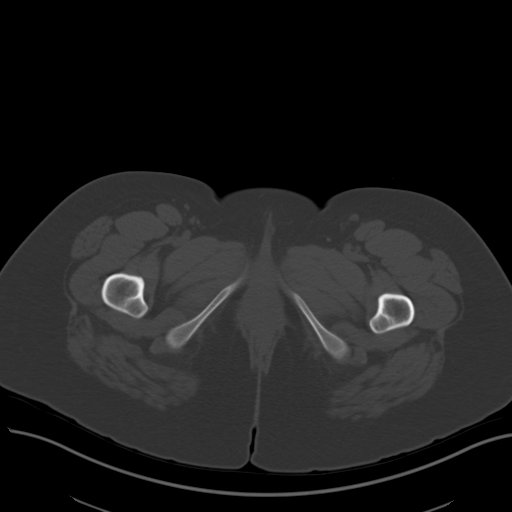
[im 12/89  soft-tissue]
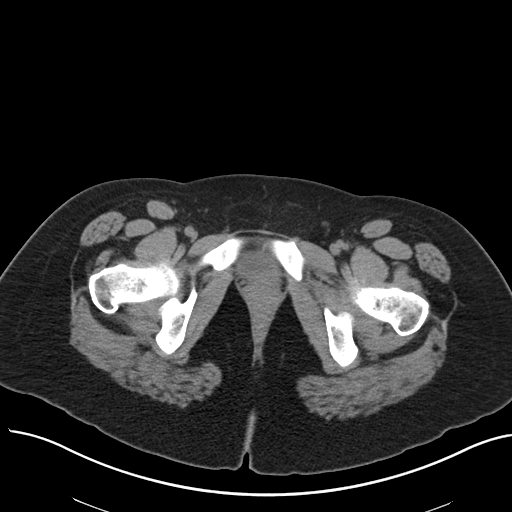
[im 18/89  soft-tissue]
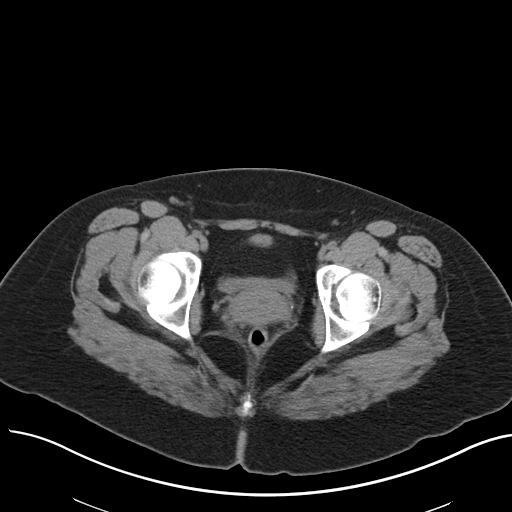
[im 24/89  soft-tissue]
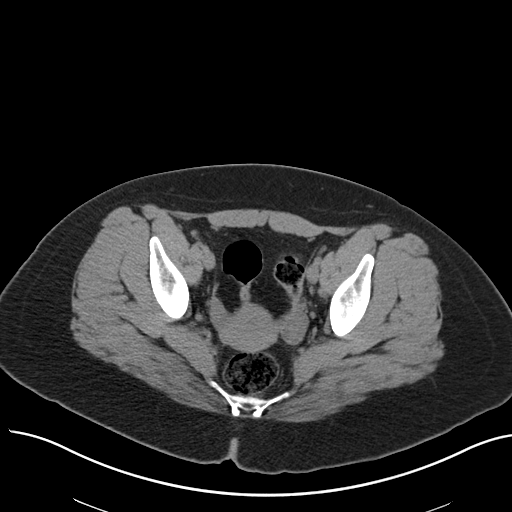
[im 30/89  soft-tissue]
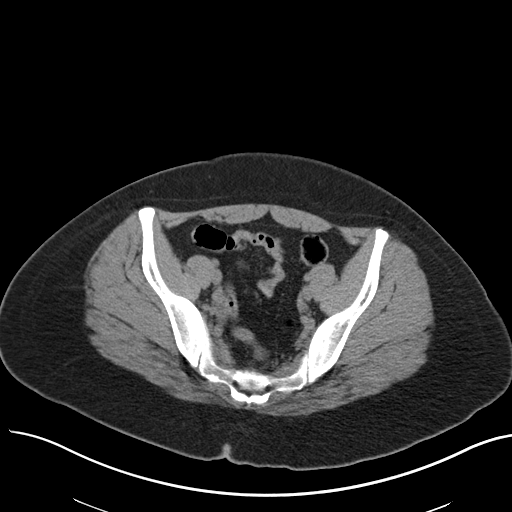
[im 36/89  soft-tissue]
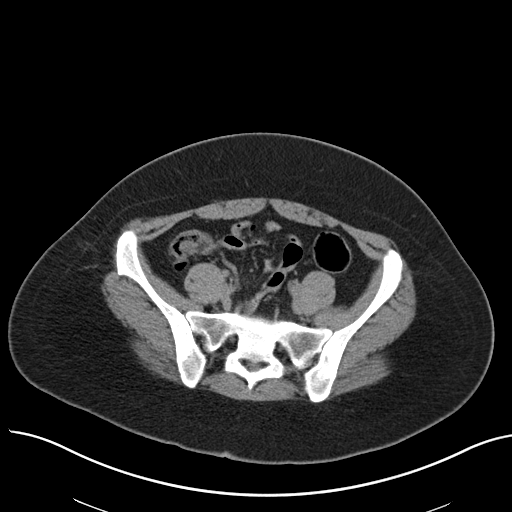
[im 42/89  soft-tissue]
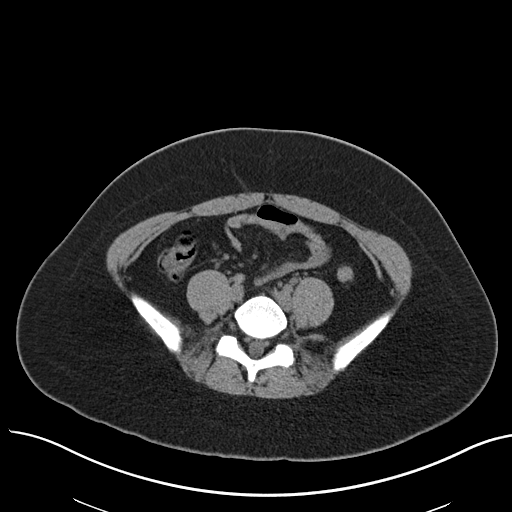
[im 47/89  soft-tissue]
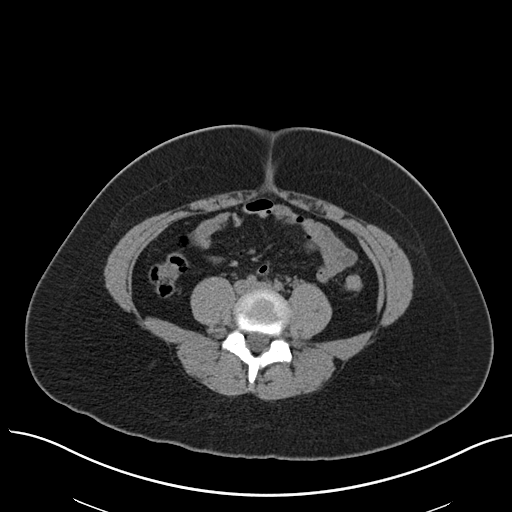
[im 53/89  soft-tissue]
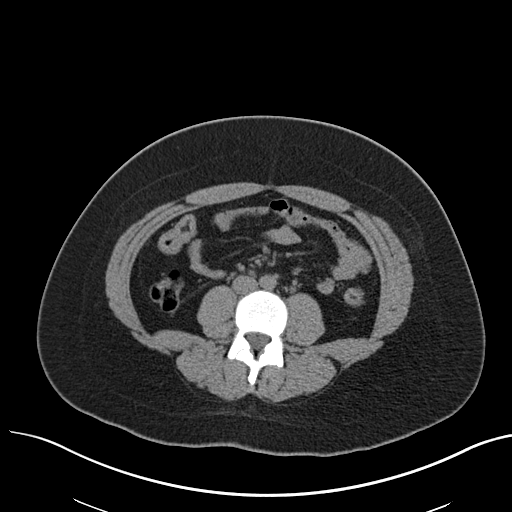
[im 53/89  bone]
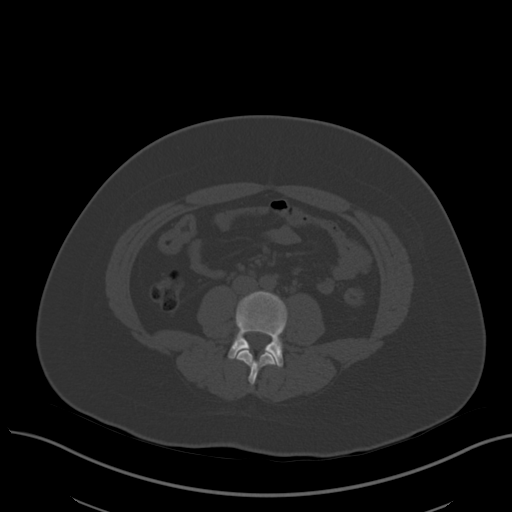
[im 59/89  soft-tissue]
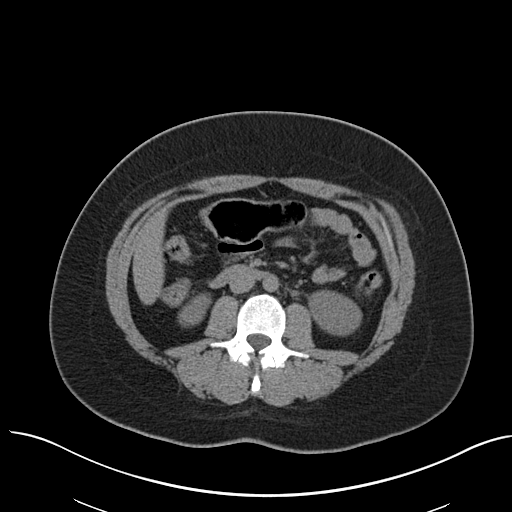
[im 65/89  soft-tissue]
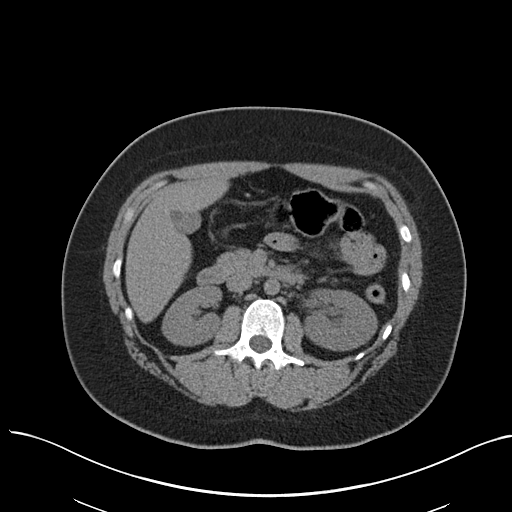
[im 71/89  soft-tissue]
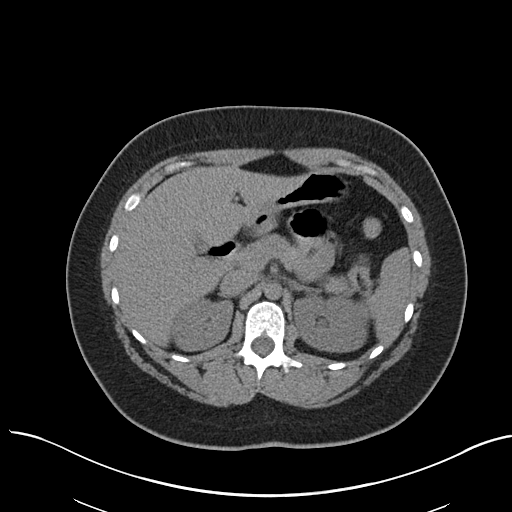
[im 77/89  soft-tissue]
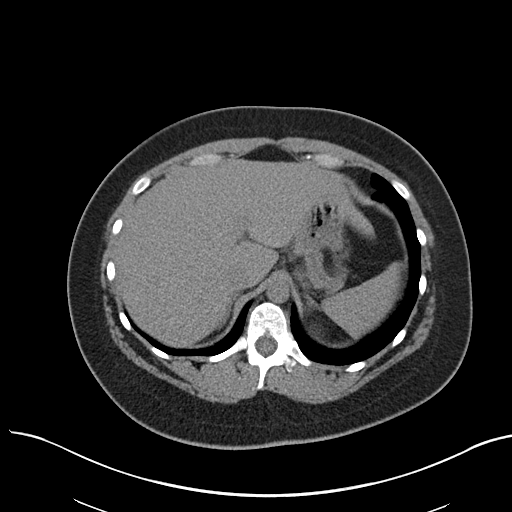
[im 83/89  soft-tissue]
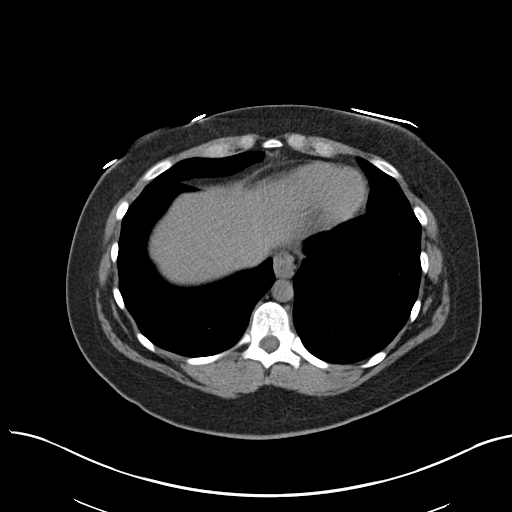

[Series 4: coronal · coronal · 0.84mm/px · 3 of 151 slices shown]
[im 51/151  soft-tissue]
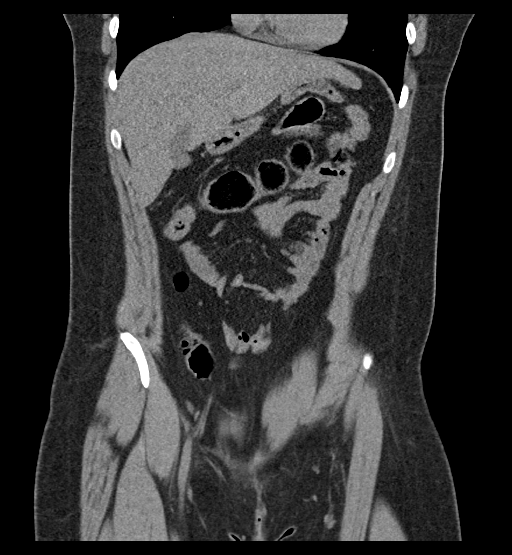
[im 67/151  soft-tissue]
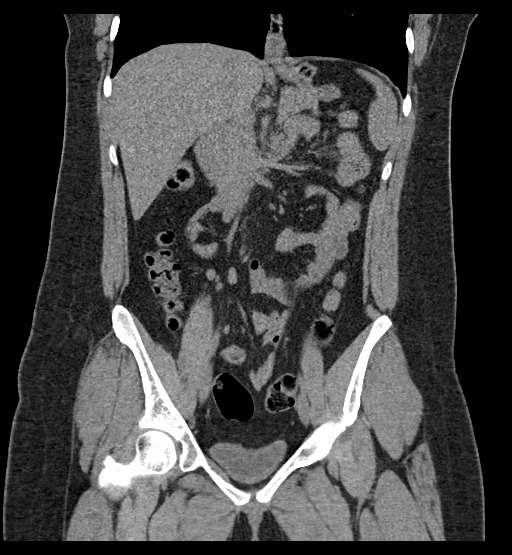
[im 84/151  soft-tissue]
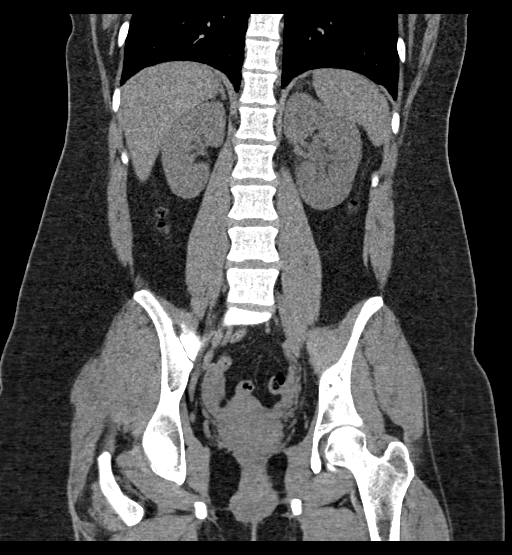

[17 of 46 positions shown; findings below may reference images not displayed]

FINDINGS: Lower chest: No acute abnormality.

Hepatobiliary: Unremarkable noncontrast appearance of the hepatic
parenchyma. Gallbladder is unremarkable. No biliary ductal dilation.

Pancreas: No evidence of acute pancreatic inflammation or pancreatic
ductal dilation.

Spleen: Within normal limits.

Adrenals/Urinary Tract: Bilateral adrenal glands are unremarkable.
Very mild left hydroureteronephrosis to the level of a 3 mm stone in
the distal ureter just proximal to the UVJ. Previously visualized
3-4 mm left interpolar renal stone is no longer present on today's
examination. Right kidneys unremarkable.

Stomach/Bowel: Stomach is within normal limits. Appendix appears
normal. No evidence of bowel wall thickening, distention, or
inflammatory changes.

Vascular/Lymphatic: No significant vascular findings are present. No
enlarged abdominal or pelvic lymph nodes.

Reproductive: Uterus and bilateral adnexa are unremarkable.

Other: No abdominopelvic ascites.

Musculoskeletal: No acute or significant osseous findings.
IMPRESSION: Very mild LEFT hydroureteronephrosis to the level of a 3 mm stone in
the distal ureter just proximal to the UVJ.

## 2021-10-06 ENCOUNTER — Ambulatory Visit
Admission: EM | Admit: 2021-10-06 | Discharge: 2021-10-06 | Disposition: A | Discharge status: Home / Self Care | Payer: Managed Care, Other (non HMO) | Attending: Physician Assistant | Admitting: Physician Assistant

## 2021-10-06 ENCOUNTER — Encounter: Payer: Self-pay | Admitting: Physician Assistant

## 2021-10-06 DIAGNOSIS — Z113 Encounter for screening for infections with a predominantly sexual mode of transmission: Secondary | ICD-10-CM | POA: Diagnosis present

## 2021-10-06 DIAGNOSIS — Z3202 Encounter for pregnancy test, result negative: Secondary | ICD-10-CM | POA: Diagnosis not present

## 2021-10-06 DIAGNOSIS — B379 Candidiasis, unspecified: Secondary | ICD-10-CM | POA: Diagnosis not present

## 2021-10-06 DIAGNOSIS — Z202 Contact with and (suspected) exposure to infections with a predominantly sexual mode of transmission: Secondary | ICD-10-CM | POA: Diagnosis not present

## 2021-10-06 LAB — POCT URINE PREGNANCY: Preg Test, Ur: NEGATIVE

## 2021-10-06 NOTE — ED Triage Notes (Signed)
Pt presents to [facility] with need of sti testing. Pt reports to no symptoms at the moment chance for pregnancy.

## 2021-10-06 NOTE — ED Provider Notes (Signed)
[facility]    CSN: [id number] Arrival date & time: 10/06/21  1705      History   Chief Complaint Chief Complaint  Patient presents with   sti test    HPI Krystal Morton is a 21 y.o. female.   Patient here today for STD screening. She cannot rule out pregnancy. LMP was over 4 weeks ago and she admits to missing a few weeks of birth control. She has not had any other symptoms.   The history is provided by the patient.    Past Medical History:  Diagnosis Date   Benign tumor    History of surgical removal of lesion    right ilium pelvis/ bone tumor;  s/p excision removal 10-04-2015 benign osteochondroma   Irritable bowel syndrome with diarrhea    Left ureteral stone     Patient Active Problem List   Diagnosis Date Noted   Chest pain 12/11/2020   Thoracolumbar back pain 12/11/2020   Body mass index (BMI) of 30.0-30.9 in adult 12/11/2020   Mixed hyperlipidemia 12/11/2020    Past Surgical History:  Procedure Laterality Date   BONE TUMOR EXCISION  10/04/2015   '@[facility]'$ ;   right pelvis  osteochondroma   TONSILLECTOMY  2010   TUMOR REMOVAL     WISDOM TOOTH EXTRACTION      OB History   No obstetric history on file.      Home Medications    Prior to Admission medications   Medication Sig Start Date End Date Taking? Authorizing Provider  FLUoxetine (PROZAC) 10 MG tablet Take 10 mg by mouth daily.    [provider]  ibuprofen (ADVIL) 600 MG tablet Take 1 tablet (600 mg total) by mouth every 6 (six) hours as needed. 06/19/21   Melynda Ripple, MD  VIENVA 0.1-20 MG-MCG tablet Take 1 tablet by mouth daily. 10/05/20   [provider]    Family History Family History  Problem Relation Age of Onset   Hypertension Mother    High blood pressure Mother    Arthritis/Rheumatoid Mother    High blood pressure Maternal Grandmother    Colon cancer Maternal Grandmother     Social History Social History   Tobacco Use   Smoking  status: Never   Smokeless tobacco: Never  Vaping Use   Vaping Use: Never used  Substance Use Topics   Alcohol use: No   Drug use: No     Allergies   Dicyclomine   Review of Systems Review of Systems  Constitutional:  Negative for chills and fever.  Eyes:  Negative for discharge and redness.  Gastrointestinal:  Negative for abdominal pain, nausea and vomiting.  Genitourinary:  Negative for genital sores, vaginal bleeding and vaginal discharge.     Physical Exam Triage Vital Signs ED Triage Vitals  Enc Vitals Group     BP 10/06/21 1750 125/83     Pulse Rate 10/06/21 1750 70     Resp 10/06/21 1750 20     Temp 10/06/21 1750 98.3 F (36.8 C)     Temp src --      SpO2 10/06/21 1750 99 %     Weight --      Height --      Head Circumference --      Peak Flow --      Pain Score 10/06/21 1755 0     Pain Loc --      Pain Edu? --      Excl. in [place]? --  No data found.  Updated Vital Signs BP 125/83   Pulse 70   Temp 98.3 F (36.8 C)   Resp 20   LMP 09/02/2021 (Approximate)   SpO2 99%      Physical Exam Vitals and nursing note reviewed.  Constitutional:      General: She is not in acute distress.    Appearance: Normal appearance. She is not ill-appearing.  HENT:     Head: Normocephalic and atraumatic.  Eyes:     Conjunctiva/sclera: Conjunctivae normal.  Cardiovascular:     Rate and Rhythm: Normal rate.  Pulmonary:     Effort: Pulmonary effort is normal.  Neurological:     Mental Status: She is alert.  Psychiatric:        Mood and Affect: Mood normal.        Behavior: Behavior normal.        Thought Content: Thought content normal.      UC Treatments / Results  Labs (all labs ordered are listed, but only abnormal results are displayed) Labs Reviewed  POCT URINE PREGNANCY  CERVICOVAGINAL ANCILLARY ONLY    EKG   Radiology No results found.  Procedures Procedures (including critical care time)  Medications Ordered in UC Medications - No  data to display  Initial Impression / Assessment and Plan / UC Course  I have reviewed the triage vital signs and the nursing notes.  Pertinent labs & imaging results that were available during my care of the patient were reviewed by me and considered in my medical decision making (see chart for details).    Urine pregnancy test negative in office but recommended she repeat in one week if she continues to not have menstrual period. STD screening ordered as requested.   Final Clinical Impressions(s) / UC Diagnoses   Final diagnoses:  Screening for STD (sexually transmitted disease)   Discharge Instructions   None    ED Prescriptions   None    PDMP not reviewed this encounter.   Francene Finders, PA-C 10/06/21 [phone]

## 2021-10-08 ENCOUNTER — Telehealth (HOSPITAL_COMMUNITY): Payer: Self-pay | Admitting: Emergency Medicine

## 2021-10-08 LAB — CERVICOVAGINAL ANCILLARY ONLY
Bacterial Vaginitis (gardnerella): NEGATIVE
Candida Glabrata: NEGATIVE
Candida Vaginitis: POSITIVE — AB
Chlamydia: NEGATIVE
Comment: NEGATIVE
Comment: NEGATIVE
Comment: NEGATIVE
Comment: NEGATIVE
Comment: NEGATIVE
Comment: NORMAL
Neisseria Gonorrhea: NEGATIVE
Trichomonas: NEGATIVE

## 2021-10-08 MED ORDER — FLUCONAZOLE 150 MG PO TABS: 150.0000 mg | ORAL_TABLET | Freq: Once | ORAL | 0 refills | Status: AC

## 2022-01-07 IMAGING — US US BREAST*R* LIMITED INC AXILLA
1 series · 2 of 2 positions shown · non-contrast
Comparison: Previous exam(s).

CLINICAL DATA: Intermittently tender palpable lump in the right
breast at about 1 o'clock.

EXAM:
ULTRASOUND OF THE RIGHT BREAST

[Series 1: us breast*right* limited inc axilla · 0.07mm/px · 2 of 2 slices shown]
[im 1/2]
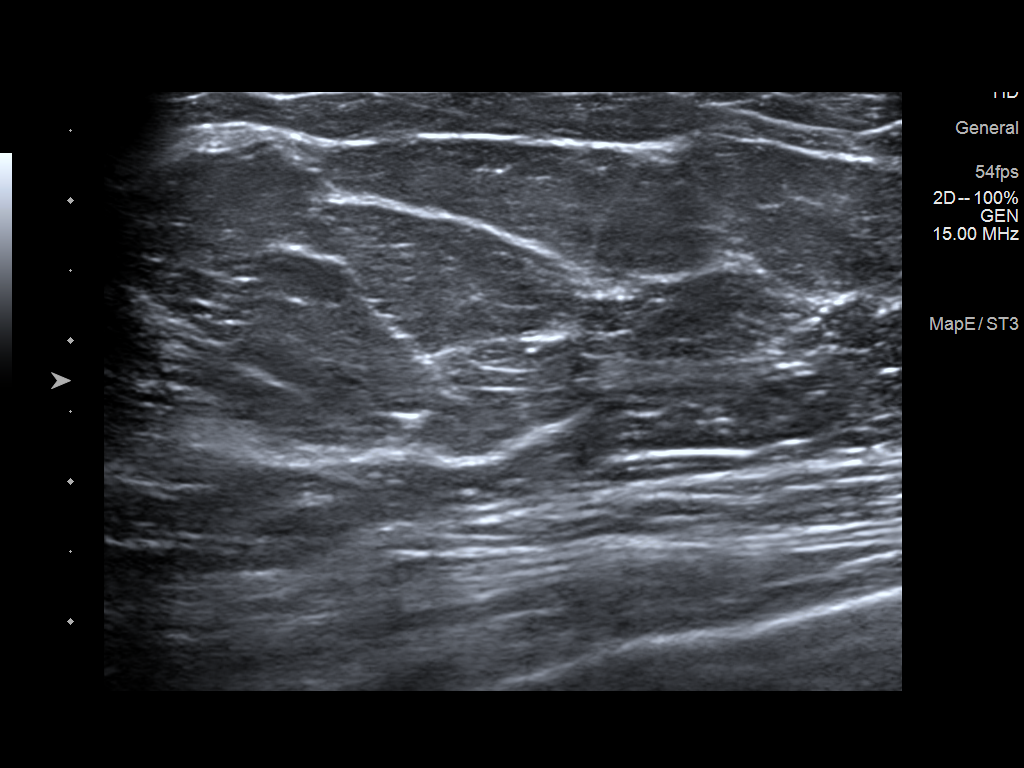
[im 2/2]
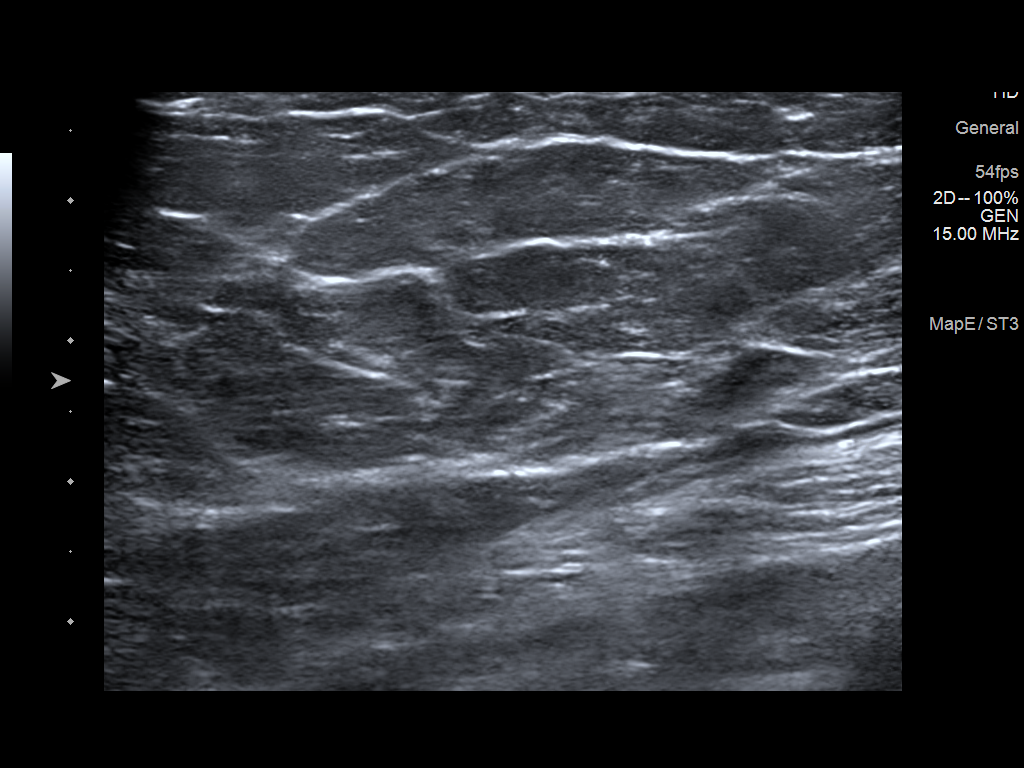

[2 of 2 positions shown; findings below may reference images not displayed]

FINDINGS: On physical exam, no suspicious lumps are identified.

Targeted ultrasound is performed, showing normal tissue in the
region the patient's palpable lump. I believe the patient is likely
feeling an island of glandular tissue.
IMPRESSION: No sonographic evidence of malignancy.

RECOMMENDATION:
Treatment of the patient's symptoms should be based on clinical and
physical exam given lack of imaging findings. Recommend annual
screening mammography beginning at the age of 40.

I have discussed the findings and recommendations with the patient.
If applicable, a reminder letter will be sent to the patient
regarding the next appointment.

BI-RADS CATEGORY  1: Negative.

## 2022-02-05 ENCOUNTER — Other Ambulatory Visit: Payer: Self-pay

## 2022-02-05 ENCOUNTER — Emergency Department (HOSPITAL_BASED_OUTPATIENT_CLINIC_OR_DEPARTMENT_OTHER): Payer: Managed Care, Other (non HMO)

## 2022-02-05 ENCOUNTER — Encounter (HOSPITAL_BASED_OUTPATIENT_CLINIC_OR_DEPARTMENT_OTHER): Payer: Self-pay | Admitting: *Deleted

## 2022-02-05 ENCOUNTER — Emergency Department (HOSPITAL_BASED_OUTPATIENT_CLINIC_OR_DEPARTMENT_OTHER)
Admission: EM | Admit: 2022-02-05 | Discharge: 2022-02-05 | Disposition: A | Discharge status: Home / Self Care | Payer: Managed Care, Other (non HMO) | Attending: Emergency Medicine | Admitting: Emergency Medicine

## 2022-02-05 DIAGNOSIS — R1110 Vomiting, unspecified: Secondary | ICD-10-CM | POA: Insufficient documentation

## 2022-02-05 DIAGNOSIS — R3129 Other microscopic hematuria: Secondary | ICD-10-CM | POA: Insufficient documentation

## 2022-02-05 DIAGNOSIS — R112 Nausea with vomiting, unspecified: Secondary | ICD-10-CM | POA: Insufficient documentation

## 2022-02-05 DIAGNOSIS — R197 Diarrhea, unspecified: Secondary | ICD-10-CM | POA: Diagnosis not present

## 2022-02-05 DIAGNOSIS — R531 Weakness: Secondary | ICD-10-CM | POA: Insufficient documentation

## 2022-02-05 DIAGNOSIS — K589 Irritable bowel syndrome without diarrhea: Secondary | ICD-10-CM | POA: Diagnosis not present

## 2022-02-05 DIAGNOSIS — R1031 Right lower quadrant pain: Secondary | ICD-10-CM | POA: Diagnosis present

## 2022-02-05 DIAGNOSIS — R1030 Lower abdominal pain, unspecified: Secondary | ICD-10-CM

## 2022-02-05 HISTORY — DX: Papillomavirus as the cause of diseases classified elsewhere: B97.7

## 2022-02-05 LAB — CBC
HCT: 46.1 % — ABNORMAL HIGH (ref 36.0–46.0)
Hemoglobin: 15.4 g/dL — ABNORMAL HIGH (ref 12.0–15.0)
MCH: 30 pg (ref 26.0–34.0)
MCHC: 33.4 g/dL (ref 30.0–36.0)
MCV: 89.7 fL (ref 80.0–100.0)
Platelets: 361 10*3/uL (ref 150–400)
RBC: 5.14 MIL/uL — ABNORMAL HIGH (ref 3.87–5.11)
RDW: 12.8 % (ref 11.5–15.5)
WBC: 5.8 10*3/uL (ref 4.0–10.5)
nRBC: 0 % (ref 0.0–0.2)

## 2022-02-05 LAB — COMPREHENSIVE METABOLIC PANEL
ALT: 28 U/L (ref 0–44)
AST: 13 U/L — ABNORMAL LOW (ref 15–41)
Albumin: 4.3 g/dL (ref 3.5–5.0)
Alkaline Phosphatase: 95 U/L (ref 38–126)
Anion gap: 7 (ref 5–15)
BUN: 9 mg/dL (ref 6–20)
CO2: 26 mmol/L (ref 22–32)
Calcium: 9.5 mg/dL (ref 8.9–10.3)
Chloride: 103 mmol/L (ref 98–111)
Creatinine, Ser: 0.86 mg/dL (ref 0.44–1.00)
GFR, Estimated: >60 mL/min (ref >60)
Glucose, Bld: 93 mg/dL (ref 70–99)
Potassium: 4 mmol/L (ref 3.5–5.1)
Sodium: 136 mmol/L (ref 135–145)
Total Bilirubin: 0.5 mg/dL (ref 0.3–1.2)
Total Protein: 7.5 g/dL (ref 6.5–8.1)

## 2022-02-05 LAB — URINALYSIS, ROUTINE W REFLEX MICROSCOPIC
Bacteria, UA: NONE SEEN
Bilirubin Urine: NEGATIVE
Glucose, UA: NEGATIVE mg/dL
Ketones, ur: NEGATIVE mg/dL
Leukocytes,Ua: NEGATIVE
Nitrite: NEGATIVE
Protein, ur: NEGATIVE mg/dL
Specific Gravity, Urine: 1.025 (ref 1.005–1.030)
pH: 5.5 (ref 5.0–8.0)

## 2022-02-05 LAB — WET PREP, GENITAL
Clue Cells Wet Prep HPF POC: NONE SEEN
Sperm: NONE SEEN
Trich, Wet Prep: NONE SEEN
WBC, Wet Prep HPF POC: <10 (ref <10)
Yeast Wet Prep HPF POC: NONE SEEN

## 2022-02-05 LAB — LIPASE, BLOOD: Lipase: <10 U/L — ABNORMAL LOW (ref 11–51)

## 2022-02-05 LAB — PREGNANCY, URINE: Preg Test, Ur: NEGATIVE

## 2022-02-05 MED ORDER — SODIUM CHLORIDE 0.9 % IV BOLUS
1000.0000 mL | Freq: Once | INTRAVENOUS | Status: AC
  Administered 2022-02-05: 1000 mL via INTRAVENOUS

## 2022-02-05 MED ORDER — DICYCLOMINE HCL 20 MG PO TABS: 20.0000 mg | ORAL_TABLET | Freq: Two times a day (BID) | ORAL | 0 refills | Status: DC

## 2022-02-05 MED ORDER — IOHEXOL 300 MG/ML  SOLN
100.0000 mL | Freq: Once | INTRAMUSCULAR | Status: AC | PRN
  Administered 2022-02-05: 80 mL via INTRAVENOUS

## 2022-02-05 MED ORDER — ONDANSETRON HCL 4 MG/2ML IJ SOLN
4.0000 mg | Freq: Once | INTRAMUSCULAR | Status: DC | PRN
Start: 2022-02-05 — End: 2022-02-05

## 2022-02-05 NOTE — ED Notes (Signed)
Patient transported to CT 

## 2022-02-05 NOTE — ED Triage Notes (Addendum)
Patient with sudden onset of right sided abd pain on yesterday.  It is more lower but does radiate to the upper quad at times.  Patient states she felt weak and dizzy.  She has also had nausea with one episode of emesis.  Patient also reported diarrhea x 3.  Patient denies any urinary sx.  Denies any vaginal bleeding.  Patient is sexaully active.  Preg test on yesterday was negative.  Patient states her pain is coming and going.  It is sharp.  She had an abdominal xray at work (she works for an [employer]) that was negative.

## 2022-02-05 NOTE — Discharge Instructions (Signed)

## 2022-02-05 NOTE — ED Provider Notes (Signed)
[facility] Provider Note   CSN: [id number] Arrival date & time: 02/05/22  0848     History  Chief Complaint  Patient presents with   Abdominal Pain   Weakness   Nausea   Emesis   Diarrhea         Krystal Morton is a 22 y.o. female.  [facility] emergency department chief complaint of right lower quadrant abdominal pain.  Her symptoms began yesterday.  She has a past medical history of irritable bowel syndrome for which she takes medication that keeps it well-controlled and states that it is only really bad when she is extremely anxious.  Yesterday her pain began in the right lower quadrant.  It does not radiate.  It is worse with walking and movement, better with rest.  Currently 5 out of 10 but yesterday far worse and severe.  She had about 8 episodes of vomiting, 3 episodes of diarrhea.  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  She is not sure of her last menstrual period but states she has taken several pregnancy tests at home that have been negative.  She continues to have pain in the right lower quadrant.  She denies any vaginal symptoms, dyspareunia, urinary symptoms, flank pain.She denies contacts with similar sxs, ingestion of suspect foods or water, history of similar sxs, recent foreign travel .    Abdominal Pain Associated symptoms: diarrhea and vomiting   Weakness Associated symptoms: abdominal pain, diarrhea and vomiting   Emesis Associated symptoms: abdominal pain and diarrhea   Diarrhea Associated symptoms: abdominal pain and vomiting        Home Medications Prior to Admission medications   Medication Sig Start Date End Date Taking? Authorizing Provider  FLUoxetine (PROZAC) 10 MG tablet Take 10 mg by mouth daily.    [provider]  ibuprofen (ADVIL) 600 MG tablet Take 1 tablet (600 mg total) by mouth every 6 (six) hours as needed. 06/19/21   Melynda Ripple,  MD  VIENVA 0.1-20 MG-MCG tablet Take 1 tablet by mouth daily. 10/05/20   [provider]      Allergies    Dicyclomine    Review of Systems   Review of Systems  Gastrointestinal:  Positive for abdominal pain, diarrhea and vomiting.  Neurological:  Positive for weakness.    Physical Exam Updated Vital Signs BP (!) 149/113 (BP Location: Left Arm)   Pulse 84   Temp 98.2 F (36.8 C)   Resp 16   Ht '5\' 6"'$  (1.676 m)   Wt 97.5 kg   SpO2 97%   BMI 34.70 kg/m  Physical Exam Vitals and nursing note reviewed.  Constitutional:      General: She is not in acute distress.    Appearance: She is well-developed. She is not diaphoretic.  HENT:     Head: Normocephalic and atraumatic.     Right Ear: External ear normal.     Left Ear: External ear normal.     Nose: Nose normal.     Mouth/Throat:     Mouth: Mucous membranes are moist.  Eyes:     General: No scleral icterus.    Conjunctiva/sclera: Conjunctivae normal.  Cardiovascular:     Rate and Rhythm: Normal rate and regular rhythm.     Heart sounds: Normal heart sounds. No murmur heard.    No friction rub. No gallop.  Pulmonary:     Effort: Pulmonary effort is normal.  No respiratory distress.     Breath sounds: Normal breath sounds.  Abdominal:     General: Bowel sounds are normal. There is no distension.     Palpations: Abdomen is soft. There is no mass.     Tenderness: There is abdominal tenderness in the right lower quadrant. There is no guarding.  Genitourinary:    Comments: Pelvic exam: normal external genitalia, vulva, vagina, cervix, uterus and adnexa  Musculoskeletal:     Cervical back: Normal range of motion.  Skin:    General: Skin is warm and dry.  Neurological:     Mental Status: She is alert and oriented to person, place, and time.  Psychiatric:        Behavior: Behavior normal.     ED Results / Procedures / Treatments   Labs (all labs ordered are listed, but only abnormal results are  displayed) Labs Reviewed  LIPASE, BLOOD - Abnormal; Notable for the following components:      Result Value   Lipase <10 (*)    All other components within normal limits  COMPREHENSIVE METABOLIC PANEL - Abnormal; Notable for the following components:   AST 13 (*)    All other components within normal limits  CBC - Abnormal; Notable for the following components:   RBC 5.14 (*)    Hemoglobin 15.4 (*)    HCT 46.1 (*)    All other components within normal limits  URINALYSIS, ROUTINE W REFLEX MICROSCOPIC - Abnormal; Notable for the following components:   Hgb urine dipstick MODERATE (*)    All other components within normal limits  PREGNANCY, URINE    EKG None  Radiology No results found.  Procedures Procedures    Medications Ordered in ED Medications  ondansetron (ZOFRAN) injection 4 mg (has no administration in time range)  sodium chloride 0.9 % bolus 1,000 mL (1,000 mLs Intravenous New Bag/Given 02/05/22 3220)    ED Course/ Medical Decision Making/ A&P Clinical Course as of 02/05/22 0949  Fri Feb 05, 2022  0948 Urinalysis, Routine w reflex microscopic -Urine, Clean Catch(!) [AH]  2542 Comprehensive metabolic panel(!) [AH]  7062 Pregnancy, urine [AH]  0948 CBC(!) [AH]  0948 Lipase, blood(!) [AH]    Clinical Course User Index [AH] Krystal Mail, PA-C                             Medical Decision Making Krystal Morton is a 22 y.o. female who presents with rlq ABD PAIN Differential diagnosis of her lower abdominal considerations include pelvic inflammatory disease, ectopic pregnancy, appendicitis, urinary calculi, primary dysmenorrhea, septic abortion, ruptured ovarian cyst or tumor, ovarian torsion, tubo-ovarian abscess, degeneration of fibroid, endometriosis, diverticulitis, cystitis.   The history is gathered by PATIENT and emr -----------------------------------------------------------------              02/05/22        02/05/22     02/05/22   02/05/22                 0858            0859         0900      1045    -----------------------------------------------------------------  BP:        (!) 149/113                  (!) 145/89  133/77    Pulse:  84                           78        91      Resp:          16                                     16      Temp:   98.2 F (36.8 C)                                     SpO2:          97%                         98%       97%      Weight:                      97.5 kg                          Height:                  '5\' 6"'$  (1.676 m)                     -----------------------------------------------------------------  EKG Interpretation  Date/Time:    Ventricular Rate:    PR Interval:    QRS Duration:   QT Interval:    QTC Calculation:   R Axis:     Text Interpretation:       I personally reviewed the patient's labs which show  Wet prep negative for, urine without evidence of infection, negative pregnancy test, normal lipase, CMP within normal limits, CBC with slightly elevated hemoglobin and CBC also shows no leukocytosis I personally reviewed the images (CT abdomen pelvis) which show (no acute findings).   Given the large differential diagnosis for Sheryle Hail, the decision making in this case is of high complexity.  After evaluating all of the data points in this case, the presentation of [place] is NOT consistent with AAA; Mesenteric Ischemia; Bowel Perforation; Bowel Obstruction; Sigmoid Volvulus; Diverticulitis; Appendicitis; Peritonitis; Cholecystitis, ascending cholangitis or other gallbladder disease; perforated ulcer; significant GI bleeding, splenic rupture/infarction; Hepatic abscess; or other surgical/acute abdomen.  Similarly, this presentation is NOT consistent with fistula; incarcerated hernia; Pancreatitis; Diabetic Ketoacidosis; Kidney Stone; Ischemic colitis; Psoas or other  abscess; Methanol poisoning; Heavy metal toxicity; or porphyria.  The case is NOT consistent with Fitz-Hugh-Curtis Syndrome, Ectopic Pregnancy, Placental Abruption, PID, Tubo-ovarian abscess, Ovarian Torsion, or STI. This presentation is also NOT consistent with acute coronary syndrome, MI, pulmonary embolism, dissection, borhaave's, arrythmia, pneumothorax, cardiac tamponade, or other emergent cardiopulmonary condition. Moreover, this presentation is NOT consistent with sepsis, pyelonephritis, urinary infection, pneumonia, or other focal bacterial infection.  Strict return and follow-up precautions have been given by me personally  ore by detailed written instructions verbalized by nursing staff using the teach back method to the patient/family/caregiver(s).  Data Reviewed/Counseling: I have reviewed the patient's vital signs, nursing notes, and other relevant tests/information. I had a detailed discussion regarding the historical points, exam findings, and any diagnostic results supporting  the discharge diagnosis. I also discussed the need for outpatient follow-up and the need to return to the ED if symptoms worsen or if there are any questions or concerns that arise at home    Amount and/or Complexity of Data Reviewed Labs: ordered. Decision-making details documented in ED Course. Radiology: ordered.  Risk Prescription drug management.           Final Clinical Impression(s) / ED Diagnoses Final diagnoses:  None    Rx / DC Orders ED Discharge Orders     None         Krystal Mail, PA-C 02/05/22 1827    Wyvonnia Dusky, MD 02/06/22 [phone]

## 2022-02-08 LAB — GC/CHLAMYDIA PROBE AMP (~~LOC~~) NOT AT ARMC
Chlamydia: NEGATIVE
Comment: NEGATIVE
Comment: NORMAL
Neisseria Gonorrhea: NEGATIVE

## 2022-02-10 ENCOUNTER — Ambulatory Visit
Admission: EM | Admit: 2022-02-10 | Discharge: 2022-02-10 | Disposition: A | Discharge status: Home / Self Care | Payer: Managed Care, Other (non HMO) | Attending: Internal Medicine | Admitting: Internal Medicine

## 2022-02-10 DIAGNOSIS — L739 Follicular disorder, unspecified: Secondary | ICD-10-CM | POA: Diagnosis not present

## 2022-02-10 DIAGNOSIS — N898 Other specified noninflammatory disorders of vagina: Secondary | ICD-10-CM

## 2022-02-10 MED ORDER — MUPIROCIN 2 % EX OINT: 1.0000 | TOPICAL_OINTMENT | Freq: Two times a day (BID) | CUTANEOUS | 0 refills | Status: DC

## 2022-02-10 NOTE — Discharge Instructions (Signed)
I have prescribed a topical ointment to apply directly to affected area.  Also use warm compresses.  Follow-up if symptoms persist or worsen.

## 2022-02-10 NOTE — ED Triage Notes (Signed)
Patient states she has a possible cyst or ingrown hair on right labia. Noticed it about a week ago.

## 2022-02-10 NOTE — ED Provider Notes (Signed)
[facility]    CSN: [id number] Arrival date & time: 02/10/22  1431      History   Chief Complaint Chief Complaint  Patient presents with   Cyst    HPI Krystal Morton is a 22 y.o. female.   Patient presents with bump to right lower vaginal area.  Patient reports that it has been present for a few days.  Denies any drainage from the area.  Denies history of the same.  Denies fever.     Past Medical History:  Diagnosis Date   Benign tumor    History of surgical removal of lesion    right ilium pelvis/ bone tumor;  s/p excision removal 10-04-2015 benign osteochondroma   HPV (human papilloma virus) infection    Irritable bowel syndrome with diarrhea    Left ureteral stone     Patient Active Problem List   Diagnosis Date Noted   Chest pain 12/11/2020   Thoracolumbar back pain 12/11/2020   Body mass index (BMI) of 30.0-30.9 in adult 12/11/2020   Mixed hyperlipidemia 12/11/2020    Past Surgical History:  Procedure Laterality Date   BONE TUMOR EXCISION  10/04/2015   '@[facility]'$ ;   right pelvis  osteochondroma   TONSILLECTOMY  2010   TUMOR REMOVAL     WISDOM TOOTH EXTRACTION      OB History   No obstetric history on file.      Home Medications    Prior to Admission medications   Medication Sig Start Date End Date Taking? Authorizing Provider  albuterol (VENTOLIN HFA) 108 (90 Base) MCG/ACT inhaler Inhale 2 puffs into the lungs every 4 (four) hours as needed. 01/01/22  Yes [provider]  ARIPiprazole (ABILIFY) 5 MG tablet Take 5 mg by mouth daily. 12/29/21  Yes [provider]  FLUoxetine (PROZAC) 10 MG tablet Take 10 mg by mouth daily.   Yes [provider]  FLUoxetine (PROZAC) 40 MG capsule Take 40 mg by mouth daily. 12/14/21  Yes [provider]  ibuprofen (ADVIL) 600 MG tablet Take 1 tablet (600 mg total) by mouth every 6 (six) hours as needed. 06/19/21  Yes Melynda Ripple, MD  mupirocin ointment (BACTROBAN)  2 % Apply 1 Application topically 2 (two) times daily. 02/10/22  Yes Polk Minor, Hildred Alamin E, FNP  VIENVA 0.1-20 MG-MCG tablet Take 1 tablet by mouth daily. 10/05/20  Yes [provider]  dicyclomine (BENTYL) 20 MG tablet Take 1 tablet (20 mg total) by mouth 2 (two) times daily. 02/05/22   Margarita Mail, PA-C    Family History Family History  Problem Relation Age of Onset   Hypertension Mother    High blood pressure Mother    Arthritis/Rheumatoid Mother    High blood pressure Maternal Grandmother    Colon cancer Maternal Grandmother     Social History Social History   Tobacco Use   Smoking status: Never   Smokeless tobacco: Never  Vaping Use   Vaping Use: Never used  Substance Use Topics   Alcohol use: Yes    Comment: rarely   Drug use: No     Allergies   Dicyclomine   Review of Systems Review of Systems Per HPI  Physical Exam Triage Vital Signs ED Triage Vitals  Enc Vitals Group     BP 02/10/22 1512 (!) 141/81     Pulse Rate 02/10/22 1512 97     Resp 02/10/22 1512 17     Temp 02/10/22 1512 98.8 F (37.1 C)  Temp Source 02/10/22 1512 Oral     SpO2 02/10/22 1512 97 %     Weight --      Height --      Head Circumference --      Peak Flow --      Pain Score 02/10/22 1513 2     Pain Loc --      Pain Edu? --      Excl. in [place]? --    No data found.  Updated Vital Signs BP (!) 141/81 (BP Location: Right Arm)   Pulse 97   Temp 98.8 F (37.1 C) (Oral)   Resp 17   LMP 12/07/2021 (Approximate) Comment: irregular periods  SpO2 97%   Visual Acuity Right Eye Distance:   Left Eye Distance:   Bilateral Distance:    Right Eye Near:   Left Eye Near:    Bilateral Near:     Physical Exam Exam conducted with a chaperone present.  Constitutional:      General: She is not in acute distress.    Appearance: Normal appearance. She is not toxic-appearing or diaphoretic.  HENT:     Head: Normocephalic and atraumatic.  Eyes:     Extraocular Movements:  Extraocular movements intact.     Conjunctiva/sclera: Conjunctivae normal.  Pulmonary:     Effort: Pulmonary effort is normal.  Genitourinary:      Comments: Patient has pinpoint slightly raised area present to right outer labia.  No drainage noted.  No erythema noted. Neurological:     General: No focal deficit present.     Mental Status: She is alert and oriented to person, place, and time. Mental status is at baseline.  Psychiatric:        Mood and Affect: Mood normal.        Behavior: Behavior normal.        Thought Content: Thought content normal.        Judgment: Judgment normal.      UC Treatments / Results  Labs (all labs ordered are listed, but only abnormal results are displayed) Labs Reviewed - No data to display  EKG   Radiology No results found.  Procedures Procedures (including critical care time)  Medications Ordered in UC Medications - No data to display  Initial Impression / Assessment and Plan / UC Course  I have reviewed the triage vital signs and the nursing notes.  Pertinent labs & imaging results that were available during my care of the patient were reviewed by me and considered in my medical decision making (see chart for details).     Physical exam appears consistent with folliculitis.  It is very small so no I&D necessary.  Do not think oral antibiotics are necessary given appearance so will treat with mupirocin topically.  No concern for HSV.  Advised patient of warm compresses as well.  Patient advised to follow-up if symptoms persist or worsen.  Patient verbalized understanding and was agreeable with plan. Final Clinical Impressions(s) / UC Diagnoses   Final diagnoses:  Vaginal lesion  Folliculitis     Discharge Instructions      I have prescribed a topical ointment to apply directly to affected area.  Also use warm compresses.  Follow-up if symptoms persist or worsen.    ED Prescriptions     Medication Sig Dispense Auth.  Provider   mupirocin ointment (BACTROBAN) 2 % Apply 1 Application topically 2 (two) times daily. 22 g Teodora Medici, [place]  PDMP not reviewed this encounter.   Teodora Medici, [place] 02/10/22 [phone]

## 2022-03-03 ENCOUNTER — Ambulatory Visit
Admission: EM | Admit: 2022-03-03 | Discharge: 2022-03-03 | Disposition: A | Discharge status: Home / Self Care | Payer: Managed Care, Other (non HMO) | Attending: Family Medicine | Admitting: Family Medicine

## 2022-03-03 DIAGNOSIS — N898 Other specified noninflammatory disorders of vagina: Secondary | ICD-10-CM | POA: Insufficient documentation

## 2022-03-03 DIAGNOSIS — Z113 Encounter for screening for infections with a predominantly sexual mode of transmission: Secondary | ICD-10-CM | POA: Insufficient documentation

## 2022-03-03 LAB — POCT URINALYSIS DIP (MANUAL ENTRY)
Bilirubin, UA: NEGATIVE
Blood, UA: NEGATIVE
Glucose, UA: NEGATIVE mg/dL
Ketones, POC UA: NEGATIVE mg/dL
Leukocytes, UA: NEGATIVE
Nitrite, UA: NEGATIVE
Protein Ur, POC: NEGATIVE mg/dL
Spec Grav, UA: 1.025 (ref 1.010–1.025)
Urobilinogen, UA: 0.2 E.U./dL
pH, UA: 7.5 (ref 5.0–8.0)

## 2022-03-03 LAB — POCT URINE PREGNANCY: Preg Test, Ur: NEGATIVE

## 2022-03-03 MED ORDER — FLUCONAZOLE 150 MG PO TABS: ORAL_TABLET | ORAL | 0 refills | Status: DC

## 2022-03-03 NOTE — ED Triage Notes (Signed)
Pt presents with vaginal itching X 5 days.

## 2022-03-03 NOTE — Discharge Instructions (Signed)
We have sent testing for various causes of vaginal infections. We will notify you of any positive results once they are received. If required, we will prescribe any medications you might need.  Please refrain from all sexual activity for at least the next seven days.

## 2022-03-03 NOTE — ED Provider Notes (Signed)
[facility]   [id number] 03/03/22 Arrival Time: [id number]  ASSESSMENT & PLAN:  1. Vaginal itching   2. Screening for STDs (sexually transmitted diseases)    Will treat empirically for suspected yeast infection. Begin: Meds ordered this encounter  Medications   fluconazole (DIFLUCAN) 150 MG tablet    Sig: Take one tablet by mouth as a single dose. May repeat in 3 days if symptoms persist.    Dispense:  2 tablet    Refill:  0      Discharge Instructions      We have sent testing for various causes of vaginal infections. We will notify you of any positive results once they are received. If required, we will prescribe any medications you might need.  Please refrain from all sexual activity for at least the next seven days.     Without s/s of PID.  U/A normal. UPT negative.  Labs Reviewed  POCT URINALYSIS DIP (MANUAL ENTRY)  POCT URINE PREGNANCY  CERVICOVAGINAL ANCILLARY ONLY   Results for orders placed or performed during the hospital encounter of 03/03/22  POCT urinalysis dipstick  Result Value Ref Range   Color, UA yellow yellow   Clarity, UA clear clear   Glucose, UA negative negative mg/dL   Bilirubin, UA negative negative   Ketones, POC UA negative negative mg/dL   Spec Grav, UA 1.025 1.010 - 1.025   Blood, UA negative negative   pH, UA 7.5 5.0 - 8.0   Protein Ur, POC negative negative mg/dL   Urobilinogen, UA 0.2 0.2 or 1.0 E.U./dL   Nitrite, UA Negative Negative   Leukocytes, UA Negative Negative  POCT urine pregnancy  Result Value Ref Range   Preg Test, Ur Negative Negative   Will notify of any positive results. Instructed to refrain from sexual activity for at least seven days.  Reviewed expectations re: course of current medical issues. Questions answered. Outlined signs and symptoms indicating need for more acute intervention. Patient verbalized understanding. After Visit Summary given.   SUBJECTIVE:  Krystal Morton is a 22 y.o.  female who presents with complaint of vaginal itching; gradually noted over past 5 d. Does request STD screening. Questions scant vaginal discharge; not much. No specific aggravating or alleviating factors reported. Denies: urinary frequency, dysuria, and gross hematuria. Afebrile. No abdominal or pelvic pain. Normal PO intake wihout n/v. No genital rashes or lesions.  No tx PTA.   OBJECTIVE:  Vitals:   03/03/22 1259  BP: (!) 141/84  Pulse: 87  Resp: 18  Temp: 98.2 F (36.8 C)  TempSrc: Oral  SpO2: 98%     General appearance: alert, cooperative, appears stated age and no distress Lungs: unlabored respirations; speaks full sentences without difficulty Back: no CVA tenderness; FROM at waist Abdomen: soft, non-tender GU: deferred Skin: warm and dry Psychological: alert and cooperative; normal mood and affect.  Results for orders placed or performed during the hospital encounter of 03/03/22  POCT urinalysis dipstick  Result Value Ref Range   Color, UA yellow yellow   Clarity, UA clear clear   Glucose, UA negative negative mg/dL   Bilirubin, UA negative negative   Ketones, POC UA negative negative mg/dL   Spec Grav, UA 1.025 1.010 - 1.025   Blood, UA negative negative   pH, UA 7.5 5.0 - 8.0   Protein Ur, POC negative negative mg/dL   Urobilinogen, UA 0.2 0.2 or 1.0 E.U./dL   Nitrite, UA Negative Negative   Leukocytes, UA Negative Negative  POCT  urine pregnancy  Result Value Ref Range   Preg Test, Ur Negative Negative    Labs Reviewed  POCT URINALYSIS DIP (MANUAL ENTRY)  POCT URINE PREGNANCY  CERVICOVAGINAL ANCILLARY ONLY    Allergies  Allergen Reactions   Dicyclomine Other (See Comments)    Blurred vision, dizziness     Past Medical History:  Diagnosis Date   Benign tumor    History of surgical removal of lesion    right ilium pelvis/ bone tumor;  s/p excision removal 10-04-2015 benign osteochondroma   HPV (human papilloma virus) infection    Irritable bowel  syndrome with diarrhea    Left ureteral stone    Family History  Problem Relation Age of Onset   Hypertension Mother    High blood pressure Mother    Arthritis/Rheumatoid Mother    High blood pressure Maternal Grandmother    Colon cancer Maternal Grandmother    Social History   Socioeconomic History   Marital status: Single    Spouse name: Not on file   Number of children: Not on file   Years of education: Not on file   Highest education level: Not on file  Occupational History   Not on file  Tobacco Use   Smoking status: Never   Smokeless tobacco: Never  Vaping Use   Vaping Use: Never used  Substance and Sexual Activity   Alcohol use: Yes    Comment: rarely   Drug use: No   Sexual activity: Yes    Birth control/protection: Pill  Other Topics Concern   Not on file  Social History Narrative   Not on file   Social Determinants of Health   Financial Resource Strain: Not on file  Food Insecurity: Not on file  Transportation Needs: Not on file  Physical Activity: Not on file  Stress: Not on file  Social Connections: Not on file  Intimate Partner Violence: Not on file           Vanessa Kick, MD 03/04/22 [phone]

## 2022-03-04 LAB — CERVICOVAGINAL ANCILLARY ONLY
Bacterial Vaginitis (gardnerella): NEGATIVE
Candida Glabrata: NEGATIVE
Candida Vaginitis: POSITIVE — AB
Chlamydia: NEGATIVE
Comment: NEGATIVE
Comment: NEGATIVE
Comment: NEGATIVE
Comment: NEGATIVE
Comment: NEGATIVE
Comment: NORMAL
Neisseria Gonorrhea: NEGATIVE
Trichomonas: NEGATIVE

## 2022-03-10 ENCOUNTER — Telehealth: Payer: Self-pay | Admitting: *Deleted

## 2022-03-10 ENCOUNTER — Other Ambulatory Visit: Payer: Self-pay | Admitting: Nurse Practitioner

## 2022-03-10 DIAGNOSIS — B3731 Acute candidiasis of vulva and vagina: Secondary | ICD-10-CM

## 2022-03-10 MED ORDER — NYSTATIN 100000 UNIT/GM EX OINT: 1.0000 | TOPICAL_OINTMENT | Freq: Two times a day (BID) | CUTANEOUS | 1 refills | Status: DC

## 2022-03-10 NOTE — Telephone Encounter (Signed)
I guess I need to know if there is wound there or if the skin is red and more like a burning pain.

## 2022-03-10 NOTE — Telephone Encounter (Signed)
Pt says its hard to explain but its a little red. Feels more like its irritated than pain

## 2022-03-10 NOTE — Telephone Encounter (Signed)
Pt calling to see if there were any appointments today informed her we did not have anything.  She said that she was at [facility] last Wednesday and she had yeast infection and was treated and has completed the course of medication.  She is now experiencing pain in the crease on the left side of the labia, she wanted to see if this is part of the yeast infection symptoms possibly.  No openings till march 5th. Please advise and if there are any suggestions to make it feel better.

## 2022-03-10 NOTE — Telephone Encounter (Signed)
Please let her know that I do believe this is related to yeast. I was able to review the labs she had done while in urgent care and the only positive test was for candida,, which is yeast. I have sent a prescription for nystatin ointment. She can apply this to effected area twice daily for the next 7 days. May need longer than that. She can also use it for additional spots of irritation which may arise over the next 1 to 2 weeks. I sent this prescription to [facility] on [address].  Thanks so much.   -HB

## 2022-06-01 ENCOUNTER — Ambulatory Visit (INDEPENDENT_AMBULATORY_CARE_PROVIDER_SITE_OTHER): Payer: Managed Care, Other (non HMO) | Admitting: Nurse Practitioner

## 2022-06-01 ENCOUNTER — Encounter: Payer: Self-pay | Admitting: Nurse Practitioner

## 2022-06-01 VITALS — BP 130/84 | HR 75 | Ht 66.0 in | Wt 212.1 lb

## 2022-06-01 DIAGNOSIS — Z0001 Encounter for general adult medical examination with abnormal findings: Secondary | ICD-10-CM | POA: Diagnosis not present

## 2022-06-01 DIAGNOSIS — R5383 Other fatigue: Secondary | ICD-10-CM

## 2022-06-01 DIAGNOSIS — R635 Abnormal weight gain: Secondary | ICD-10-CM | POA: Diagnosis not present

## 2022-06-01 DIAGNOSIS — E782 Mixed hyperlipidemia: Secondary | ICD-10-CM | POA: Diagnosis not present

## 2022-06-01 DIAGNOSIS — Z6834 Body mass index (BMI) 34.0-34.9, adult: Secondary | ICD-10-CM

## 2022-06-01 DIAGNOSIS — E559 Vitamin D deficiency, unspecified: Secondary | ICD-10-CM

## 2022-06-01 MED ORDER — PHENTERMINE HCL 37.5 MG PO CAPS: 37.5000 mg | ORAL_CAPSULE | ORAL | 0 refills | Status: DC

## 2022-06-01 NOTE — Assessment & Plan Note (Signed)
History of hyperlipidemia. -Check fasting lipid panel and treat high cholesterol as indicated.

## 2022-06-01 NOTE — Assessment & Plan Note (Signed)
Trial of phentermine 37.5 mg capsules daily. -Reviewed risk factors and side effects associated with taking phentermine. -Discussed lowering calorie intake to 1500 calories per day and incorporating exercise into daily routine to help lose weight.  -Reassess in 1 month.

## 2022-06-01 NOTE — Assessment & Plan Note (Signed)
Check labs for further evaluation.  

## 2022-06-01 NOTE — Progress Notes (Signed)
Complete physical exam   Patient: Krystal Morton   DOB: July 09, 2000   22 y.o. Female  MRN: [id number] Visit Date: 06/01/2022    Chief Complaint  Patient presents with   Annual Exam   Subjective    Krystal Morton is a 22 y.o. female who presents today for a complete physical exam.  She reports consuming a pescitarian diet. The patient has a physically strenuous job, but has no regular exercise apart from work.  She generally feels well. She does not have additional problems to discuss today.   HPI  Annual physical -interested In weight management. --currently eating a pescitarian diet.  --decreased sodium and sugar. Has tried to eliminate fast food.  --she uses MyFitnessPal app to help track calories in and calories out. --currently maintaining >1400 calorie deficit  -states that during COVID, she did gain a lot of weight.  -weight had gone up to 232. She has been able to lose to 212, but feels like she has reached a plateau   Past Medical History:  Diagnosis Date   Benign tumor    History of surgical removal of lesion    right ilium pelvis/ bone tumor;  s/p excision removal 10-04-2015 benign osteochondroma   HPV (human papilloma virus) infection    Irritable bowel syndrome with diarrhea    Left ureteral stone    Past Surgical History:  Procedure Laterality Date   BONE TUMOR EXCISION  10/04/2015   @[facility] ;   right pelvis  osteochondroma   TONSILLECTOMY  2010   TUMOR REMOVAL     WISDOM TOOTH EXTRACTION     Social History   Socioeconomic History   Marital status: Single    Spouse name: Not on file   Number of children: Not on file   Years of education: Not on file   Highest education level: Not on file  Occupational History   Not on file  Tobacco Use   Smoking status: Never   Smokeless tobacco: Never  Vaping Use   Vaping Use: Never used  Substance and Sexual Activity   Alcohol use: Yes    Comment: rarely   Drug use: No   Sexual activity: Yes     Birth control/protection: Pill  Other Topics Concern   Not on file  Social History Narrative   Not on file   Social Determinants of Health   Financial Resource Strain: Not on file  Food Insecurity: Not on file  Transportation Needs: Not on file  Physical Activity: Not on file  Stress: Not on file  Social Connections: Not on file  Intimate Partner Violence: Not on file   Family Status  Relation Name Status   Mother  (Not Specified)   MGM  (Not Specified)   Family History  Problem Relation Age of Onset   Hypertension Mother    High blood pressure Mother    Arthritis/Rheumatoid Mother    High blood pressure Maternal Grandmother    Colon cancer Maternal Grandmother    Allergies  Allergen Reactions   Dicyclomine Other (See Comments)    Blurred vision, dizziness     Patient Care Team: Carlean Jews, NP as PCP - General (Family Medicine)   Medications: Outpatient Medications Prior to Visit  Medication Sig   ARIPiprazole (ABILIFY) 5 MG tablet Take 5 mg by mouth daily.   FLUoxetine (PROZAC) 40 MG capsule Take 40 mg by mouth daily.   [DISCONTINUED] nystatin ointment (MYCOSTATIN) Apply 1 Application topically 2 (two) times daily.  levonorgestrel-ethinyl estradiol (ALESSE) 0.1-20 MG-MCG tablet Take 1 tablet by mouth daily.   VIENVA 0.1-20 MG-MCG tablet Take 1 tablet by mouth daily.   [DISCONTINUED] albuterol (VENTOLIN HFA) 108 (90 Base) MCG/ACT inhaler Inhale 2 puffs into the lungs every 4 (four) hours as needed.   [DISCONTINUED] dicyclomine (BENTYL) 20 MG tablet Take 1 tablet (20 mg total) by mouth 2 (two) times daily.   [DISCONTINUED] fluconazole (DIFLUCAN) 150 MG tablet Take one tablet by mouth as a single dose. May repeat in 3 days if symptoms persist.   [DISCONTINUED] FLUoxetine (PROZAC) 10 MG tablet Take 10 mg by mouth daily.   [DISCONTINUED] ibuprofen (ADVIL) 600 MG tablet Take 1 tablet (600 mg total) by mouth every 6 (six) hours as needed.   [DISCONTINUED]  mupirocin ointment (BACTROBAN) 2 % Apply 1 Application topically 2 (two) times daily.   No facility-administered medications prior to visit.    Review of Systems See HPI      Objective     Today's Vitals   06/01/22 0901  BP: 130/84  Pulse: 75  SpO2: 99%  Weight: 212 lb 1.9 oz (96.2 kg)  Height: 5\' 6"  (1.676 m)   Body mass index is 34.24 kg/m.  BP Readings from Last 3 Encounters:  06/01/22 130/84  03/03/22 (Abnormal) 141/84  02/10/22 (Abnormal) 141/81    Wt Readings from Last 3 Encounters:  06/01/22 212 lb 1.9 oz (96.2 kg)  02/05/22 215 lb (97.5 kg)  12/11/20 189 lb 11.2 oz (86 kg)     Physical Exam Vitals and nursing note reviewed.  Constitutional:      Appearance: Normal appearance. She is well-developed.  HENT:     Head: Normocephalic and atraumatic.     Right Ear: Tympanic membrane, ear canal and external ear normal.     Left Ear: Tympanic membrane, ear canal and external ear normal.     Nose: Nose normal.     Mouth/Throat:     Mouth: Mucous membranes are moist.     Pharynx: Oropharynx is clear.  Eyes:     Extraocular Movements: Extraocular movements intact.     Conjunctiva/sclera: Conjunctivae normal.     Pupils: Pupils are equal, round, and reactive to light.  Neck:     Vascular: No carotid bruit.  Cardiovascular:     Rate and Rhythm: Normal rate and regular rhythm.     Pulses: Normal pulses.     Heart sounds: Normal heart sounds.  Pulmonary:     Effort: Pulmonary effort is normal.     Breath sounds: Normal breath sounds.  Abdominal:     General: Bowel sounds are normal. There is no distension.     Palpations: Abdomen is soft. There is no mass.     Tenderness: There is no abdominal tenderness. There is no right CVA tenderness, left CVA tenderness, guarding or rebound.     Hernia: No hernia is present.  Musculoskeletal:        General: Normal range of motion.     Cervical back: Normal range of motion and neck supple.  Lymphadenopathy:      Cervical: No cervical adenopathy.  Skin:    General: Skin is warm and dry.     Capillary Refill: Capillary refill takes less than 2 seconds.  Neurological:     General: No focal deficit present.     Mental Status: She is alert and oriented to person, place, and time.  Psychiatric:        Mood and Affect: Mood normal.  Behavior: Behavior normal.        Thought Content: Thought content normal.        Judgment: Judgment normal.     Last depression screening scores   Row Labels 06/01/2022    9:06 AM 12/11/2020   11:46 AM  PHQ 2/9 Scores   Section Header. No data exists in this row.    PHQ - 2 Score   1 0  PHQ- 9 Score   7 8   Last fall risk screening   Row Labels 12/11/2020   11:46 AM  Fall Risk    Section Header. No data exists in this row.   Falls in the past year?   0  Number falls in past yr:   0  Injury with Fall?   0  Follow up   Falls evaluation completed    Assessment & Plan    Encounter for general adult medical examination with abnormal findings  Mixed hyperlipidemia Assessment & Plan: History of hyperlipidemia. -Check fasting lipid panel and treat high cholesterol as indicated.  Orders: -     Hemoglobin A1c; Future -     Lipid panel; Future -     Comprehensive metabolic panel; Future  Vitamin D deficiency -     VITAMIN D 25 Hydroxy (Vit-D Deficiency, Fractures); Future  Abnormal weight gain Assessment & Plan: Check thyroid with routine labs today. -Treat hypothyroid as indicated.  Orders: -     TSH + free T4; Future -     Hemoglobin A1c; Future -     Comprehensive metabolic panel; Future -     CBC; Future  BMI 34.0-34.9,adult Assessment & Plan: Trial of phentermine 37.5 mg capsules daily. -Reviewed risk factors and side effects associated with taking phentermine. -Discussed lowering calorie intake to 1500 calories per day and incorporating exercise into daily routine to help lose weight.  -Reassess in 1 month.  Orders: -     Phentermine  HCl; Take 1 capsule (37.5 mg total) by mouth every morning.  Dispense: 30 capsule; Refill: 0  Other fatigue Assessment & Plan: Check labs for further evaluation.  Orders: -     TSH + free T4; Future -     VITAMIN D 25 Hydroxy (Vit-D Deficiency, Fractures); Future -     Hemoglobin A1c; Future -     CBC; Future    Immunization History  Administered Date(s) Administered   DTaP 04/22/1999, 07/16/1999, 11/26/1999, 02/17/2000, 06/19/2004   Dtap, Unspecified 04/22/1999, 07/16/1999, 11/26/1999, 02/17/2000   HIB (PRP-OMP) 04/22/1999, 07/16/1999, 02/17/2000   HIB, Unspecified 04/22/1999, 07/16/1999, 02/17/2000   HPV 9-valent 04/21/2020, 04/27/2021   Hepatitis A, Ped/Adol-2 Dose 03/08/2017   Hepatitis B 05-16-98, 12/26/1998, 02/17/2000   Hepatitis B, PED/ADOLESCENT 09-15-98, 12/26/1998, 02/17/2000   IPV 04/22/1999, 07/16/1999, 11/26/1999, 06/19/2004   Influenza,inj,Quad PF,6+ Mos 02/16/2017, 10/28/2020   Influenza-Unspecified 09/11/2020   MMR 11/26/1999, 06/19/2004   Meningococcal Conjugate 03/08/2017   PFIZER(Purple Top)SARS-COV-2 Vaccination 06/01/2019, 08/03/2019   PPD Test 03/08/2017   Pneumococcal Conjugate-13 07/16/1999, 11/26/1999, 04/21/2009   Pneumococcal-Unspecified 07/16/1999, 11/26/1999, 04/21/2009   Polio, Unspecified 04/22/1999, 07/16/1999, 11/26/1999, 06/19/2004   Tdap 09/01/2010   Varicella 06/19/2004, 03/08/2017    Health Maintenance  Topic Date Due   HIV Screening  Never done   Hepatitis C Screening  Never done   PAP-Cervical Cytology Screening  Never done   PAP SMEAR-Modifier  Never done   DTaP/Tdap/Td (6 - Td or Tdap) 08/31/2020   HPV VACCINES (3 - 3-dose series) 07/20/2021   COVID-19 Vaccine (  3 - 2023-24 season) 09/11/2021   INFLUENZA VACCINE  08/12/2022   CHLAMYDIA SCREENING  03/04/2023    Discussed health benefits of physical activity, and encouraged her to engage in regular exercise appropriate for her age and condition.    Return in about 4  weeks (around 06/29/2022) for routine - weight management.        Carlean Jews, NP  [facility] [phone] (phone) [phone] (fax)  [facility]

## 2022-06-01 NOTE — Assessment & Plan Note (Signed)
Check thyroid with routine labs today. -Treat hypothyroid as indicated.

## 2022-06-02 LAB — COMPREHENSIVE METABOLIC PANEL
ALT: 20 IU/L (ref 0–32)
AST: 18 IU/L (ref 0–40)
Albumin/Globulin Ratio: 1.8 (ref 1.2–2.2)
Albumin: 4.4 g/dL (ref 4.0–5.0)
Alkaline Phosphatase: 97 IU/L (ref 44–121)
BUN/Creatinine Ratio: 7 — ABNORMAL LOW (ref 9–23)
BUN: 6 mg/dL (ref 6–20)
Bilirubin Total: 0.4 mg/dL (ref 0.0–1.2)
CO2: 19 mmol/L — ABNORMAL LOW (ref 20–29)
Calcium: 9.6 mg/dL (ref 8.7–10.2)
Chloride: 103 mmol/L (ref 96–106)
Creatinine, Ser: 0.83 mg/dL (ref 0.57–1.00)
Globulin, Total: 2.5 g/dL (ref 1.5–4.5)
Glucose: 85 mg/dL (ref 70–99)
Potassium: 4.4 mmol/L (ref 3.5–5.2)
Sodium: 139 mmol/L (ref 134–144)
Total Protein: 6.9 g/dL (ref 6.0–8.5)
eGFR: 102 mL/min/{1.73_m2} (ref >59)

## 2022-06-02 LAB — CBC
Hematocrit: 44.8 % (ref 34.0–46.6)
Hemoglobin: 14.9 g/dL (ref 11.1–15.9)
MCH: 30.2 pg (ref 26.6–33.0)
MCHC: 33.3 g/dL (ref 31.5–35.7)
MCV: 91 fL (ref 79–97)
Platelets: 377 10*3/uL (ref 150–450)
RBC: 4.94 x10E6/uL (ref 3.77–5.28)
RDW: 11.7 % (ref 11.7–15.4)
WBC: 7.5 10*3/uL (ref 3.4–10.8)

## 2022-06-02 LAB — LIPID PANEL
Chol/HDL Ratio: 6.5 ratio — ABNORMAL HIGH (ref 0.0–4.4)
Cholesterol, Total: 271 mg/dL — ABNORMAL HIGH (ref 100–199)
HDL: 42 mg/dL (ref >39)
LDL Chol Calc (NIH): 216 mg/dL — ABNORMAL HIGH (ref 0–99)
Triglycerides: 79 mg/dL (ref 0–149)
VLDL Cholesterol Cal: 13 mg/dL (ref 5–40)

## 2022-06-02 LAB — HEMOGLOBIN A1C
Est. average glucose Bld gHb Est-mCnc: 103 mg/dL
Hgb A1c MFr Bld: 5.2 % (ref 4.8–5.6)

## 2022-06-02 LAB — TSH+FREE T4
Free T4: 1.34 ng/dL (ref 0.82–1.77)
TSH: 1.82 u[IU]/mL (ref 0.450–4.500)

## 2022-06-02 LAB — VITAMIN D 25 HYDROXY (VIT D DEFICIENCY, FRACTURES): Vit D, 25-Hydroxy: 11 ng/mL — ABNORMAL LOW (ref 30.0–100.0)

## 2022-06-04 ENCOUNTER — Emergency Department (HOSPITAL_BASED_OUTPATIENT_CLINIC_OR_DEPARTMENT_OTHER)
Admission: EM | Admit: 2022-06-04 | Discharge: 2022-06-04 | Disposition: A | Discharge status: Home / Self Care | Payer: Managed Care, Other (non HMO) | Attending: Emergency Medicine | Admitting: Emergency Medicine

## 2022-06-04 ENCOUNTER — Emergency Department (HOSPITAL_BASED_OUTPATIENT_CLINIC_OR_DEPARTMENT_OTHER): Payer: Managed Care, Other (non HMO)

## 2022-06-04 ENCOUNTER — Encounter (HOSPITAL_BASED_OUTPATIENT_CLINIC_OR_DEPARTMENT_OTHER): Payer: Self-pay | Admitting: Emergency Medicine

## 2022-06-04 ENCOUNTER — Telehealth: Payer: Self-pay

## 2022-06-04 ENCOUNTER — Encounter: Payer: Self-pay | Admitting: Nurse Practitioner

## 2022-06-04 ENCOUNTER — Other Ambulatory Visit: Payer: Self-pay

## 2022-06-04 DIAGNOSIS — R000 Tachycardia, unspecified: Secondary | ICD-10-CM | POA: Insufficient documentation

## 2022-06-04 DIAGNOSIS — R079 Chest pain, unspecified: Secondary | ICD-10-CM | POA: Diagnosis present

## 2022-06-04 LAB — CBC WITH DIFFERENTIAL/PLATELET
Abs Immature Granulocytes: 0.05 10*3/uL (ref 0.00–0.07)
Basophils Absolute: 0.1 10*3/uL (ref 0.0–0.1)
Basophils Relative: 1 %
Eosinophils Absolute: 0 10*3/uL (ref 0.0–0.5)
Eosinophils Relative: 0 %
HCT: 42.6 % (ref 36.0–46.0)
Hemoglobin: 14.7 g/dL (ref 12.0–15.0)
Immature Granulocytes: 1 %
Lymphocytes Relative: 32 %
Lymphs Abs: 2.8 10*3/uL (ref 0.7–4.0)
MCH: 30.4 pg (ref 26.0–34.0)
MCHC: 34.5 g/dL (ref 30.0–36.0)
MCV: 88.2 fL (ref 80.0–100.0)
Monocytes Absolute: 0.6 10*3/uL (ref 0.1–1.0)
Monocytes Relative: 7 %
Neutro Abs: 5.4 10*3/uL (ref 1.7–7.7)
Neutrophils Relative %: 59 %
Platelets: 348 10*3/uL (ref 150–400)
RBC: 4.83 MIL/uL (ref 3.87–5.11)
RDW: 11.9 % (ref 11.5–15.5)
WBC: 8.9 10*3/uL (ref 4.0–10.5)
nRBC: 0 % (ref 0.0–0.2)

## 2022-06-04 LAB — BASIC METABOLIC PANEL
Anion gap: 10 (ref 5–15)
BUN: 11 mg/dL (ref 6–20)
CO2: 25 mmol/L (ref 22–32)
Calcium: 9.3 mg/dL (ref 8.9–10.3)
Chloride: 102 mmol/L (ref 98–111)
Creatinine, Ser: 0.82 mg/dL (ref 0.44–1.00)
GFR, Estimated: >60 mL/min (ref >60)
Glucose, Bld: 83 mg/dL (ref 70–99)
Potassium: 3.4 mmol/L — ABNORMAL LOW (ref 3.5–5.1)
Sodium: 137 mmol/L (ref 135–145)

## 2022-06-04 LAB — D-DIMER, QUANTITATIVE: D-Dimer, Quant: 0.29 ug/mL-FEU (ref 0.00–0.50)

## 2022-06-04 LAB — TROPONIN I (HIGH SENSITIVITY): Troponin I (High Sensitivity): <2 ng/L (ref <18)

## 2022-06-04 LAB — PREGNANCY, URINE: Preg Test, Ur: NEGATIVE

## 2022-06-04 NOTE — ED Provider Notes (Cosign Needed Addendum)
[place] [facility] AT [facility] Provider Note   CSN: [id number] Arrival date & time: 06/04/22  2146     History  Chief Complaint  Patient presents with   Chest Pain    Krystal Morton is a 22 y.o. female with a history of hyperlipidemia, vitamin D deficiency, and IBS-D who was sent to the ED today for left-sided chest pain.  Patient reports that she started Phentermine yesterday morning for weight loss and developed left-sided chest pain last night.  The pain has persisted since, despite not taking the medication this morning.  Pain is exacerbated with deep inspirations as well as palpation of the left-sided chest wall.  Patient reports heart rate readings in the 120-150s range at home.  She denies fever, shortness of breath, recent travel, or recent surgery.  Patient does admit to being on estrogen based birth control [employer]).      Home Medications Prior to Admission medications   Medication Sig Start Date End Date Taking? Authorizing Provider  ARIPiprazole (ABILIFY) 5 MG tablet Take 5 mg by mouth daily. 12/29/21   [provider]  FLUoxetine (PROZAC) 40 MG capsule Take 40 mg by mouth daily. 12/14/21   [provider]  levonorgestrel-ethinyl estradiol (ALESSE) 0.1-20 MG-MCG tablet Take 1 tablet by mouth daily.    [provider]  phentermine 37.5 MG capsule Take 1 capsule (37.5 mg total) by mouth every morning. 06/01/22   Carlean Jews, NP  VIENVA 0.1-20 MG-MCG tablet Take 1 tablet by mouth daily. 10/05/20   [provider]      Allergies    Dicyclomine    Review of Systems   Review of Systems  Cardiovascular:  Positive for chest pain.  All other systems reviewed and are negative.   Physical Exam Updated Vital Signs BP (!) 151/107 (BP Location: Left Arm)   Pulse (!) 101   Temp 98.2 F (36.8 C) (Oral)   Resp 13   Ht 5\' 6"  (1.676 m)   Wt 96 kg   LMP 05/28/2022   SpO2 100%   BMI 34.16 kg/m  Physical  Exam Vitals and nursing note reviewed.  Constitutional:      Appearance: Normal appearance.  HENT:     Head: Normocephalic and atraumatic.     Mouth/Throat:     Mouth: Mucous membranes are moist.  Eyes:     Conjunctiva/sclera: Conjunctivae normal.     Pupils: Pupils are equal, round, and reactive to light.  Cardiovascular:     Rate and Rhythm: Regular rhythm. Tachycardia present.     Pulses: Normal pulses.     Heart sounds: Normal heart sounds.  Pulmonary:     Effort: Pulmonary effort is normal.     Breath sounds: Normal breath sounds.  Abdominal:     Palpations: Abdomen is soft.     Tenderness: There is no abdominal tenderness.  Musculoskeletal:        General: Tenderness present.     Comments: Tenderness to the left side of the chest wall  Skin:    General: Skin is warm and dry.     Findings: No rash.  Neurological:     General: No focal deficit present.     Mental Status: She is alert.  Psychiatric:        Mood and Affect: Mood normal.        Behavior: Behavior normal.     ED Results / Procedures / Treatments   Labs (all labs ordered are listed,  but only abnormal results are displayed) Labs Reviewed  BASIC METABOLIC PANEL - Abnormal; Notable for the following components:      Result Value   Potassium 3.4 (*)    All other components within normal limits  CBC WITH DIFFERENTIAL/PLATELET  PREGNANCY, URINE  D-DIMER, QUANTITATIVE  TROPONIN I (HIGH SENSITIVITY)    EKG Sinus tachycardia with a heart rate of 112 bpm.  Radiology DG Chest Portable 1 View  Result Date: 06/04/2022 CLINICAL DATA:  Chest pain EXAM: PORTABLE CHEST 1 VIEW COMPARISON:  None Available. FINDINGS: The heart size and mediastinal contours are within normal limits. Both lungs are clear. The visualized skeletal structures are unremarkable. IMPRESSION: No active disease. Electronically Signed   By: Jasmine Pang M.D.   On: 06/04/2022 22:58    Procedures Procedures: Not indicated.   Medications  Ordered in ED Medications - No data to display  ED Course/ Medical Decision Making/ A&P                             Medical Decision Making Amount and/or Complexity of Data Reviewed Labs: ordered. Radiology: ordered.   Patient is a 22 year old female who presents the ED today for left-sided chest pain. My emergent differential diagnoses are ACS versus pneumonia versus pneumothorax versus pulmonary embolism. She started Phentermine yesterday morning then developed chest pain later that night.  Patient feels intermittent palpitations and left-sided chest pain worse to touch and with deep breaths.  No shortness of breath, recent travel, or fever. She is on estrogen-based oral birth control.  No recent surgeries or history of coagulopathy.  Patient was tachycardic on cardiac auscultation with tenderness to the left-sided chest wall.  Physical exam is otherwise unremarkable.  EKG showed sinus tachycardia with a heart rate of 112 bpm. Heart score is 0.  Reviewed and interpreted lab work no signs of anemia, infection, kidney injury, or electrolyte abnormality.  Negative pregnancy test.   D-dimer is negative, low suspicion for PE.  Troponin is negative, low suspicion for ACS. Chest x-ray was unremarkable, no active disease, no cardiomegaly, low risk for pneumonia or pneumothorax.  Probable pleuritic chest pain and tachycardia secondary to medication use, advised patient to try OTC NSAIDs for relief.Instructed patient to stop Phetermine for the meantime and follow up with PCP in 3 days for further instruction on whether or not she should restart the medication.  Patient is hemodynamically stable and safe for discharge home.   Final Clinical Impression(s) / ED Diagnoses Final diagnoses:  Chest pain, unspecified type    Rx / DC Orders ED Discharge Orders     None         Maxwell Marion, PA-C 06/04/22 2312    Maxwell Marion, PA-C 06/04/22 2313    Virgina Norfolk, DO 06/05/22 1452

## 2022-06-04 NOTE — ED Triage Notes (Signed)
Pt c/o chest pain that started last night. Pt states that she recently started phentermine and has had the increased HR, but was told to be seen if she had chest pain. 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

## 2022-06-04 NOTE — Discharge Instructions (Addendum)
Discussed results with patient.  Advised patient to try OTC NSAIDs like Advil or Ibuprofen for probable pleuritic chest pain. Stay hydrated to prevent tachycardia.  Stop taking Phentermine and follow-up with primary care for re-evaluation in 3 days.

## 2022-06-04 NOTE — Telephone Encounter (Signed)
Pt is reporting chest pain feels like heart is being squeezed since starting phentermine 37.5 MG capsule  yesterday morning. Didn't take this morning.  She said if you want to send something else in please send to [facility]

## 2022-06-04 NOTE — Telephone Encounter (Signed)
Can you have the patient check with her insurance to see if they cover weight loss benefit. Specifically, will they cover Wegovy or Zepbound? Thanks - HB

## 2022-06-08 ENCOUNTER — Other Ambulatory Visit: Payer: Self-pay | Admitting: Nurse Practitioner

## 2022-06-08 ENCOUNTER — Telehealth: Payer: Self-pay

## 2022-06-08 DIAGNOSIS — E559 Vitamin D deficiency, unspecified: Secondary | ICD-10-CM

## 2022-06-08 DIAGNOSIS — Z6834 Body mass index (BMI) 34.0-34.9, adult: Secondary | ICD-10-CM

## 2022-06-08 DIAGNOSIS — R635 Abnormal weight gain: Secondary | ICD-10-CM

## 2022-06-08 MED ORDER — ERGOCALCIFEROL 1.25 MG (50000 UT) PO CAPS
50000.0000 [IU] | ORAL_CAPSULE | ORAL | 5 refills | Status: DC
Start: 2022-06-08 — End: 2022-10-01

## 2022-06-08 MED ORDER — NALTREXONE-BUPROPION HCL ER 8-90 MG PO TB12
ORAL_TABLET | ORAL | 1 refills | Status: DC
Start: 2022-06-08 — End: 2022-06-29

## 2022-06-08 NOTE — Progress Notes (Signed)
Please let the patient know that I reviewed her labs. Her vitamin d is low. I added Drisdol 96045 iu weekly, which is high dose vitamin d. She will take this weekly for next few months. Her bad and total cholesterol were also just a bit elevated. She should continue with low fat, heart healthy diet and exercise routinely. Her other labs were good.  Thanks so much.   -HB

## 2022-06-08 NOTE — Telephone Encounter (Signed)
Patient called office to see if there is an alternative that she could use being that she has been on phentermine and that is the only medication her insurance covers, patient states that phemtermine causes chest pains, and she would like to get lower dosage or see if she couls get another medication, thanks.

## 2022-06-08 NOTE — Telephone Encounter (Signed)
That is the same as Contrave and she said that it was covered by her insurance. Hopefully it will be affordable.

## 2022-06-08 NOTE — Telephone Encounter (Signed)
Please have the patient contact her insurance to see if they cover other medications to help with weight loss. That will help me figure out what might be better for her. Thanks so much.   -HB

## 2022-06-21 ENCOUNTER — Telehealth: Payer: Self-pay

## 2022-06-21 NOTE — Telephone Encounter (Signed)
I can't read the CD or disck. Can she bring a written report I can look over? I don't see it in Epic.

## 2022-06-21 NOTE — Telephone Encounter (Signed)
Pt is calling to report a spot on Rt leg that has become bothersome. Pt was able to get xray from place of employment. Has copy on disk. Pt has hx of osteochondroma. Dr suggested that she gets an MRI.  Does she need to make an appointment or can the order be placed.  Pt has an appointment to see you on 06/29/22

## 2022-06-21 NOTE — Telephone Encounter (Signed)
Does she have orthopedic provider? If not, she should probably be seen for appointment.

## 2022-06-22 ENCOUNTER — Ambulatory Visit: Payer: Managed Care, Other (non HMO) | Admitting: Nurse Practitioner

## 2022-06-22 ENCOUNTER — Encounter: Payer: Self-pay | Admitting: Nurse Practitioner

## 2022-06-22 VITALS — BP 129/87 | HR 67 | Ht 66.0 in | Wt 204.4 lb

## 2022-06-22 DIAGNOSIS — M7989 Other specified soft tissue disorders: Secondary | ICD-10-CM | POA: Insufficient documentation

## 2022-06-22 DIAGNOSIS — M79661 Pain in right lower leg: Secondary | ICD-10-CM | POA: Diagnosis not present

## 2022-06-22 NOTE — Progress Notes (Signed)
Established patient visit   Patient: Krystal Morton   DOB: 02-25-2000   21 y.o. Female  MRN: [id number] Visit Date: 06/22/2022  Chief Complaint  Patient presents with   Leg Problem   Subjective    The patient has history of osteochondroma of the right hip area.  -occurred when she was about 22 years old.  -had to be surgically removed.  -states that the aching she is feeling in the right shin area feels very similar to the aching she felt when she had the osteochondromma.   Leg Pain  The incident occurred more than 1 week ago. There was no injury mechanism. The pain is present in the right leg. The quality of the pain is described as aching. The pain is at a severity of 5/10. The symptoms are aggravated by palpation and weight bearing. She has tried nothing for the symptoms.     Medications: Outpatient Medications Prior to Visit  Medication Sig   ARIPiprazole (ABILIFY) 5 MG tablet Take 5 mg by mouth daily.   ergocalciferol (DRISDOL) 1.25 MG (50000 UT) capsule Take 1 capsule (50,000 Units total) by mouth once a week.   FLUoxetine (PROZAC) 40 MG capsule Take 40 mg by mouth daily.   Naltrexone-buPROPion HCl ER 8-90 MG TB12 Take 1 tablet po QD for one week, then take 1 tablet po BID for one week, then take 2 tablets po QAM and 1 tablet qpm for one week, then take 2 tablets po bid   VIENVA 0.1-20 MG-MCG tablet Take 1 tablet by mouth daily.   [DISCONTINUED] levonorgestrel-ethinyl estradiol (ALESSE) 0.1-20 MG-MCG tablet Take 1 tablet by mouth daily.   No facility-administered medications prior to visit.    Review of Systems See HPI     Objective     Today's Vitals   06/22/22 1438  BP: 129/87  Pulse: 67  SpO2: 100%  Weight: 204 lb 6.4 oz (92.7 kg)  Height: 5\' 6"  (1.676 m)   Body mass index is 32.99 kg/m.   Physical Exam Vitals and nursing note reviewed.  Constitutional:      Appearance: Normal appearance. She is well-developed.  HENT:     Head: Normocephalic and  atraumatic.     Nose: Nose normal.     Mouth/Throat:     Mouth: Mucous membranes are moist.     Pharynx: Oropharynx is clear.  Eyes:     Extraocular Movements: Extraocular movements intact.     Conjunctiva/sclera: Conjunctivae normal.     Pupils: Pupils are equal, round, and reactive to light.  Neck:     Vascular: No carotid bruit.  Cardiovascular:     Rate and Rhythm: Normal rate and regular rhythm.     Pulses: Normal pulses.     Heart sounds: Normal heart sounds.  Pulmonary:     Effort: Pulmonary effort is normal.     Breath sounds: Normal breath sounds.  Abdominal:     Palpations: Abdomen is soft.  Musculoskeletal:        General: Normal range of motion.     Cervical back: Normal range of motion and neck supple.       Legs:  Lymphadenopathy:     Cervical: No cervical adenopathy.  Skin:    General: Skin is warm and dry.     Capillary Refill: Capillary refill takes less than 2 seconds.  Neurological:     General: No focal deficit present.     Mental Status: She is alert and oriented to person, place,  and time.  Psychiatric:        Mood and Affect: Mood normal.        Behavior: Behavior normal.        Thought Content: Thought content normal.        Judgment: Judgment normal.       Assessment & Plan    Mass of soft tissue of lower leg Assessment & Plan: Small, palpable mass of right lower leg -tender -history of osteochondroma of right femur.  -x-ray results non-diagnostic.  -Mri ordered of right tib/fib for further evaluation.   Orders: -     MR TIBIA FIBULA RIGHT W WO CONTRAST; Future  Pain of right lower leg Assessment & Plan: Small, palpable mass of right lower leg -tender -history of osteochondroma of right femur.  -x-ray results non-diagnostic.  -Mri ordered of right tib/fib for further evaluation.   Orders: -     MR TIBIA FIBULA RIGHT W WO CONTRAST; Future     Return Follow up PRN.        Carlean Jews, NP  [facility] at  [facility] [phone] (phone) [phone] (fax)  [facility]

## 2022-06-22 NOTE — Assessment & Plan Note (Signed)
Small, palpable mass of right lower leg -tender -history of osteochondroma of right femur.  -x-ray results non-diagnostic.  -Mri ordered of right tib/fib for further evaluation.

## 2022-06-29 ENCOUNTER — Ambulatory Visit: Payer: Managed Care, Other (non HMO) | Admitting: Nurse Practitioner

## 2022-06-29 ENCOUNTER — Encounter: Payer: Self-pay | Admitting: Nurse Practitioner

## 2022-06-29 VITALS — BP 131/84 | HR 72 | Ht 66.0 in | Wt 203.8 lb

## 2022-06-29 DIAGNOSIS — Z6832 Body mass index (BMI) 32.0-32.9, adult: Secondary | ICD-10-CM | POA: Diagnosis not present

## 2022-06-29 DIAGNOSIS — F411 Generalized anxiety disorder: Secondary | ICD-10-CM | POA: Insufficient documentation

## 2022-06-29 DIAGNOSIS — R635 Abnormal weight gain: Secondary | ICD-10-CM

## 2022-06-29 MED ORDER — NALTREXONE-BUPROPION HCL ER 8-90 MG PO TB12
ORAL_TABLET | ORAL | 2 refills | Status: DC
Start: 2022-06-29 — End: 2023-04-04

## 2022-06-29 NOTE — Assessment & Plan Note (Signed)
Improved with 8 pound weight loss since starting Contrave.  -continue to titrate dosing until taking 2 tablets twice daily  -Continue with low calorie, high-protein diet. Incorporate exercise into daily activities.

## 2022-06-29 NOTE — Assessment & Plan Note (Signed)
Patient on medication and seeing psychiatry for management

## 2022-06-29 NOTE — Assessment & Plan Note (Signed)
Improved with 8 pound weight loss since starting Contrave.  -continue to titrate dosing until taking 2 tablets twice daily  -Continue with low calorie, high-protein diet. Incorporate exercise into daily activities.   

## 2022-06-29 NOTE — Progress Notes (Signed)
Established patient visit   Patient: Krystal Morton   DOB: 01-06-2001   21 y.o. Female  MRN: [id number] Visit Date: 06/29/2022   Chief Complaint  Patient presents with   Medical Management of Chronic Issues   Subjective    HPI  Weight management. Initially started phentermine on 06/04/2022 -had to stop due to negative side effects (chest pain).  =changed to Naltrexone/bupropion. She has weaned up to two tablets in AM and one tablet in PM Starting weight 06/04/2022 - 211 Today's weight 06/29/2022 - 203 -no negative side effects with taking this medication. -continues with low calorie diet.  -she did just get an elliptical and will start using it this week.   Elevated PHQ 2/9 today     Row Labels 06/29/2022    8:57 AM 06/22/2022    2:40 PM 06/01/2022    9:06 AM 12/11/2020   11:46 AM  Depression screen PHQ 2/9   Section Header. No data exists in this row.      Decreased Interest   2 1 1  0  Down, Depressed, Hopeless   1 1 0 0  PHQ - 2 Score   3 2 1  0  Altered sleeping   2 1 1 2   Tired, decreased energy   3 1 3 2   Change in appetite   0 0 1 2  Feeling bad or failure about yourself    0 0 0 0  Trouble concentrating   2 1 1 2   Moving slowly or fidgety/restless   0 0 0 0  Suicidal thoughts   0 0 0 0  PHQ-9 Score   10 5 7 8   Difficult doing work/chores   Somewhat difficult Not difficult at all Somewhat difficult     She is currently oh medication to treat GAD/depression. She sees psychiatry for management.   Medications: Outpatient Medications Prior to Visit  Medication Sig   ARIPiprazole (ABILIFY) 5 MG tablet Take 5 mg by mouth daily.   ergocalciferol (DRISDOL) 1.25 MG (50000 UT) capsule Take 1 capsule (50,000 Units total) by mouth once a week.   FLUoxetine (PROZAC) 40 MG capsule Take 40 mg by mouth daily.   VIENVA 0.1-20 MG-MCG tablet Take 1 tablet by mouth daily.   [DISCONTINUED] Naltrexone-buPROPion HCl ER 8-90 MG TB12 Take 1 tablet po QD for one week, then take 1  tablet po BID for one week, then take 2 tablets po QAM and 1 tablet qpm for one week, then take 2 tablets po bid   No facility-administered medications prior to visit.    Review of Systems 8 pound weight loss since last visit     Objective     Today's Vitals   06/29/22 0856  BP: 131/84  Pulse: 72  SpO2: 99%  Weight: 203 lb 12.8 oz (92.4 kg)  Height: 5\' 6"  (1.676 m)   Body mass index is 32.89 kg/m.  BP Readings from Last 3 Encounters:  06/29/22 131/84  06/22/22 129/87  06/04/22 (Abnormal) 140/92    Wt Readings from Last 3 Encounters:  06/29/22 203 lb 12.8 oz (92.4 kg)  06/22/22 204 lb 6.4 oz (92.7 kg)  06/04/22 211 lb 10.3 oz (96 kg)    Physical Exam Vitals and nursing note reviewed.  Constitutional:      Appearance: Normal appearance. She is well-developed.  HENT:     Head: Normocephalic and atraumatic.     Nose: Nose normal.     Mouth/Throat:     Mouth: Mucous membranes are  moist.     Pharynx: Oropharynx is clear.  Eyes:     Extraocular Movements: Extraocular movements intact.     Conjunctiva/sclera: Conjunctivae normal.     Pupils: Pupils are equal, round, and reactive to light.  Neck:     Vascular: No carotid bruit.  Cardiovascular:     Rate and Rhythm: Normal rate and regular rhythm.     Pulses: Normal pulses.     Heart sounds: Normal heart sounds.  Pulmonary:     Effort: Pulmonary effort is normal.     Breath sounds: Normal breath sounds.  Abdominal:     Palpations: Abdomen is soft.  Musculoskeletal:        General: Normal range of motion.     Cervical back: Normal range of motion and neck supple.  Lymphadenopathy:     Cervical: No cervical adenopathy.  Skin:    General: Skin is warm and dry.     Capillary Refill: Capillary refill takes less than 2 seconds.  Neurological:     General: No focal deficit present.     Mental Status: She is alert and oriented to person, place, and time.  Psychiatric:        Mood and Affect: Mood normal.         Behavior: Behavior normal.        Thought Content: Thought content normal.        Judgment: Judgment normal.      Assessment & Plan    Abnormal weight gain Assessment & Plan: Improved with 8 pound weight loss since starting Contrave.  -continue to titrate dosing until taking 2 tablets twice daily  -Continue with low calorie, high-protein diet. Incorporate exercise into daily activities.  Orders: -     Naltrexone-buPROPion HCl ER; Take 2 tablets po BID  Dispense: 120 tablet; Refill: 2  BMI 32.0-32.9,adult Assessment & Plan: Improved with 8 pound weight loss since starting Contrave.  -continue to titrate dosing until taking 2 tablets twice daily  -Continue with low calorie, high-protein diet. Incorporate exercise into daily activities.    Orders: -     Naltrexone-buPROPion HCl ER; Take 2 tablets po BID  Dispense: 120 tablet; Refill: 2  GAD (generalized anxiety disorder) Assessment & Plan: Patient on medication and seeing psychiatry for management       Return in about 3 months (around 09/29/2022) for routine - weight management.         Carlean Jews, NP  [facility] at [facility] [phone] (phone) [phone] (fax)  [facility]

## 2022-07-01 ENCOUNTER — Ambulatory Visit
Admission: RE | Admit: 2022-07-01 | Discharge: 2022-07-01 | Disposition: A | Discharge status: Home / Self Care | Payer: Managed Care, Other (non HMO) | Source: Ambulatory Visit | Attending: Nurse Practitioner | Admitting: Nurse Practitioner

## 2022-07-01 DIAGNOSIS — M79661 Pain in right lower leg: Secondary | ICD-10-CM

## 2022-07-01 DIAGNOSIS — M7989 Other specified soft tissue disorders: Secondary | ICD-10-CM

## 2022-07-01 MED ORDER — GADOPICLENOL 0.5 MMOL/ML IV SOLN
9.5000 mL | Freq: Once | INTRAVENOUS | Status: AC | PRN
  Administered 2022-07-01: 9.5 mL via INTRAVENOUS

## 2022-07-05 NOTE — Progress Notes (Signed)
Please let the patient know that the MRI of her shin was normal. There does not appear to be any mass present.  Thanks so much.   -HB

## 2022-09-29 ENCOUNTER — Ambulatory Visit: Payer: Managed Care, Other (non HMO) | Admitting: Family Medicine

## 2022-09-29 ENCOUNTER — Encounter: Payer: Self-pay | Admitting: Family Medicine

## 2022-09-29 VITALS — BP 125/85 | HR 72 | Ht 66.0 in | Wt 186.1 lb

## 2022-09-29 DIAGNOSIS — E559 Vitamin D deficiency, unspecified: Secondary | ICD-10-CM

## 2022-09-29 DIAGNOSIS — Z23 Encounter for immunization: Secondary | ICD-10-CM | POA: Diagnosis not present

## 2022-09-29 DIAGNOSIS — R635 Abnormal weight gain: Secondary | ICD-10-CM

## 2022-09-29 NOTE — Assessment & Plan Note (Signed)
Rechecking vitamin D level today.  Mostly compliant with her weekly vitamin D supplementation.

## 2022-09-29 NOTE — Patient Instructions (Signed)
It was nice to see you today,  We addressed the following topics today: -I will recheck your vitamin D and get back to you with the results. - Great job on the weight loss.  Continue taking your medication and using your calorie counting diet.  Follow-up with me in 3 months  Have a great day,  Frederic Jericho, MD

## 2022-09-29 NOTE — Progress Notes (Signed)
Established Patient Office Visit  Subjective   Patient ID: Krystal Morton, female    DOB: 10/19/2000  Age: 22 y.o. MRN: [id number]  Chief Complaint  Patient presents with   Weight Check    HPI Patient here for weight management.  Currently on Contrave at the full dose of 2 tablets twice daily.  She has lost roughly 20 pounds since her last visit.  She is using a reduced calorie diet around 1500 cal/day.  Her goal weight is 140 pounds.  She was exercising briefly but has not been able to find the time due to working 2 jobs as a Psychologist, sport and exercise.  No issues with medications.  No questions or concerns.  Patient taking her vitamin D, sometimes misses a dose.  She is okay with getting her vitamin D level checked again today.    The ASCVD Risk score (Arnett DK, et al., 2019) failed to calculate for the following reasons:   The 2019 ASCVD risk score is only valid for ages 46 to 71  Health Maintenance Due  Topic Date Due   HIV Screening  Never done   Hepatitis C Screening  Never done   DTaP/Tdap/Td (6 - Td or Tdap) 08/31/2020   HPV VACCINES (3 - 3-dose series) 07/20/2021   COVID-19 Vaccine (3 - 2023-24 season) 09/12/2022      Objective:     BP 125/85   Pulse 72   Ht 5\' 6"  (1.676 m)   Wt 186 lb 1.9 oz (84.4 kg)   SpO2 97%   BMI 30.04 kg/m    Physical Exam General: Alert and oriented Pulmonary: No respiratory distress Psych: Pleasant affect, spontaneous speech, good eye contact.  Speech   No results found for any visits on 09/29/22.      Assessment & Plan:   Vitamin D deficiency Assessment & Plan: Rechecking vitamin D level today.  Mostly compliant with her weekly vitamin D supplementation.  Orders: -     VITAMIN D 25 Hydroxy (Vit-D Deficiency, Fractures)  Need for influenza vaccination -     Flu vaccine trivalent PF, 6mos and older(Flulaval,Afluria,Fluarix,Fluzone)  Abnormal weight gain Assessment & Plan: Patient has had roughly 20 pounds of weight loss  since her last visit in May.  She is on full dose of Contrave.  On a reduced calorie diet.  Exercises occasionally.  - Congratulated patient on her weight loss and encouraged continued compliance - Follow-up 3 months - Goal weight 140 pounds.      Return in about 3 months (around 12/29/2022) for Weight management.    Sandre Kitty, MD

## 2022-09-29 NOTE — Assessment & Plan Note (Signed)
Patient has had roughly 20 pounds of weight loss since her last visit in May.  She is on full dose of Contrave.  On a reduced calorie diet.  Exercises occasionally.  - Congratulated patient on her weight loss and encouraged continued compliance - Follow-up 3 months - Goal weight 140 pounds.

## 2022-09-30 LAB — VITAMIN D 25 HYDROXY (VIT D DEFICIENCY, FRACTURES): Vit D, 25-Hydroxy: 21.6 ng/mL — ABNORMAL LOW (ref 30.0–100.0)

## 2022-10-01 ENCOUNTER — Other Ambulatory Visit: Payer: Self-pay | Admitting: Family Medicine

## 2022-10-01 DIAGNOSIS — E559 Vitamin D deficiency, unspecified: Secondary | ICD-10-CM

## 2022-10-01 MED ORDER — ERGOCALCIFEROL 1.25 MG (50000 UT) PO CAPS
50000.0000 [IU] | ORAL_CAPSULE | ORAL | 0 refills | Status: DC
Start: 2022-10-01 — End: 2022-12-20

## 2022-12-14 ENCOUNTER — Telehealth: Payer: Self-pay | Admitting: Nurse Practitioner

## 2022-12-14 ENCOUNTER — Encounter: Payer: Self-pay | Admitting: Family Medicine

## 2022-12-14 NOTE — Telephone Encounter (Signed)
Pt is needing a new medication for weight loss the one she is on is no longer covered by her insurance she would like something that is covered by her insurance.

## 2022-12-19 ENCOUNTER — Other Ambulatory Visit: Payer: Self-pay | Admitting: Nurse Practitioner

## 2022-12-19 DIAGNOSIS — E559 Vitamin D deficiency, unspecified: Secondary | ICD-10-CM

## 2022-12-20 ENCOUNTER — Other Ambulatory Visit: Payer: Self-pay | Admitting: Family Medicine

## 2022-12-20 MED ORDER — PHENTERMINE HCL 15 MG PO CAPS: 15.0000 mg | ORAL_CAPSULE | ORAL | 1 refills | Status: DC

## 2022-12-20 NOTE — Telephone Encounter (Signed)
Good morning. This patient has you listed as Primary.

## 2023-01-25 ENCOUNTER — Encounter: Payer: Self-pay | Admitting: Family Medicine

## 2023-01-26 ENCOUNTER — Other Ambulatory Visit: Payer: Self-pay | Admitting: Family Medicine

## 2023-01-26 MED ORDER — PHENTERMINE HCL 37.5 MG PO CAPS: 37.5000 mg | ORAL_CAPSULE | ORAL | 0 refills | Status: DC

## 2023-01-26 NOTE — Telephone Encounter (Unsigned)
 Copied from CRM [id number]. Topic: Clinical - Prescription Issue >> Jan 26, 2023 10:27 AM Brynn Caras wrote: Reason for CRM: The patient will be unable to pick up her phentermine  37.5 MG capsule refill from [facility] today, she is requesting for this to be sent closer to her employer. The preferred pharmacy name is [facility] - [address] [place], [place] [address] Phone: [phone] Fax: [phone]

## 2023-02-23 ENCOUNTER — Other Ambulatory Visit: Payer: Self-pay | Admitting: Family Medicine

## 2023-02-23 ENCOUNTER — Telehealth: Payer: Self-pay

## 2023-02-23 MED ORDER — PHENTERMINE HCL 37.5 MG PO CAPS: 37.5000 mg | ORAL_CAPSULE | ORAL | 2 refills | Status: DC

## 2023-02-23 MED ORDER — PHENTERMINE HCL 37.5 MG PO CAPS: 37.5000 mg | ORAL_CAPSULE | ORAL | 0 refills | Status: DC

## 2023-02-23 NOTE — Telephone Encounter (Signed)
Okay I resent it to [facility]

## 2023-02-23 NOTE — Addendum Note (Signed)
Addended by: Sandre Kitty on: 02/23/2023 01:55 PM   Modules accepted: Orders

## 2023-02-23 NOTE — Telephone Encounter (Signed)
Copied from CRM [id number]. Topic: Clinical - Prescription Issue >> Feb 23, 2023 12:59 PM Elle L wrote: Reason for CRM: The patient is requesting that her phentermine 37.5 MG capsule be sent to [facility] Address: [address], [place], [place] [address] Phone: [phone] as it was sent to [facility].

## 2023-02-23 NOTE — Telephone Encounter (Signed)
Copied from CRM [id number]. Topic: Clinical - Medication Refill >> Feb 23, 2023 11:13 AM Antony Haste wrote: Most Recent Primary Care Visit:  Provider: Sandre Kitty  Department: [facility]  Visit Type: FOLLOW UP 20  Date: 09/29/2022  Medication: phentermine 37.5 MG capsule   Has the patient contacted their pharmacy? Yes (Agent: If no, request that the patient contact the pharmacy for the refill. If patient does not wish to contact the pharmacy document the reason why and proceed with request.) (Agent: If yes, when and what did the pharmacy advise?)  Is this the correct pharmacy for this prescription? Yes If no, delete pharmacy and type the correct one.  This is the patient's preferred pharmacy:   [facility] - [place], [place] - [address] [place] [address] Phone: [phone] Fax: [phone]   Has the prescription been filled recently? No  Is the patient out of the medication? Yes  Has the patient been seen for an appointment in the last year OR does the patient have an upcoming appointment? Yes  Can we respond through MyChart? No  Agent: Please be advised that Rx refills may take up to 3 business days. We ask that you follow-up with your pharmacy.

## 2023-04-03 NOTE — Progress Notes (Unsigned)
   Established Patient Office Visit  Subjective   Patient ID: Krystal Morton, female    DOB: March 29, 2000  Age: 23 y.o. MRN: [id number]  No chief complaint on file.   HPI  Weight-phentermine 37.5   The ASCVD Risk score (Arnett DK, et al., 2019) failed to calculate for the following reasons:   The 2019 ASCVD risk score is only valid for ages 26 to 57  Health Maintenance Due  Topic Date Due   HIV Screening  Never done   Hepatitis C Screening  Never done   DTaP/Tdap/Td (6 - Td or Tdap) 08/31/2020   HPV VACCINES (3 - 3-dose series) 07/20/2021   COVID-19 Vaccine (3 - 2024-25 season) 09/12/2022   CHLAMYDIA SCREENING  03/04/2023   Cervical Cancer Screening (Pap smear)  04/22/2023      Objective:     There were no vitals taken for this visit. {Vitals History (Optional):23777}  Physical Exam   No results found for any visits on 04/04/23.      Assessment & Plan:   There are no diagnoses linked to this encounter.   No follow-ups on file.    Sandre Kitty, MD

## 2023-04-04 ENCOUNTER — Ambulatory Visit (INDEPENDENT_AMBULATORY_CARE_PROVIDER_SITE_OTHER): Payer: Self-pay | Admitting: Family Medicine

## 2023-04-04 ENCOUNTER — Encounter: Payer: Self-pay | Admitting: Family Medicine

## 2023-04-04 VITALS — BP 137/83 | HR 73 | Ht 66.0 in | Wt 168.1 lb

## 2023-04-04 DIAGNOSIS — F411 Generalized anxiety disorder: Secondary | ICD-10-CM

## 2023-04-04 DIAGNOSIS — E162 Hypoglycemia, unspecified: Secondary | ICD-10-CM | POA: Diagnosis not present

## 2023-04-04 DIAGNOSIS — R635 Abnormal weight gain: Secondary | ICD-10-CM

## 2023-04-04 MED ORDER — ARIPIPRAZOLE 5 MG PO TABS: 5.0000 mg | ORAL_TABLET | Freq: Every day | ORAL | 0 refills | Status: DC

## 2023-04-04 MED ORDER — FLUOXETINE HCL 40 MG PO CAPS: 40.0000 mg | ORAL_CAPSULE | Freq: Every day | ORAL | 0 refills | Status: DC

## 2023-04-04 MED ORDER — PHENTERMINE HCL 37.5 MG PO CAPS: 37.5000 mg | ORAL_CAPSULE | ORAL | 2 refills | Status: AC

## 2023-04-04 NOTE — Assessment & Plan Note (Signed)
 Patient currently on phentermine.  Not taking Contrave.  Has lost about 20 pounds since our last visit.  Exercises regularly.  Continue phentermine.

## 2023-04-04 NOTE — Assessment & Plan Note (Signed)
 Continue Abilify and fluoxetine.  I will refill her medication for this month until she follows up with her psychiatrist again.

## 2023-04-04 NOTE — Patient Instructions (Signed)
 It was nice to see you today,  We addressed the following topics today: -I will send in a prescription for 30 days of both your Abilify and Prozac until your psychiatrist can prescribe it again - Start having a small snack such as an orange or other fruit or even a small candy bar during the midmorning to help with your low blood sugars.  If this is not helping let us know and we can send in a order for a continuous glucose monitor like a freestyle libre - I will also send in your phentermine. - We will follow-up in 3 months for your physical.  Have a great day,  Frederic Jericho, MD

## 2023-04-04 NOTE — Assessment & Plan Note (Signed)
 Patient gets hypoglycemic into the 60s (checks on a glucometer at work) usually this is before she eats her first meal a day and is likely a result of her fasting.  Recommended she eat a small snack in the midmorning to help prevent this.  If this is still an issue we can order a continuous glucose monitor

## 2023-04-26 ENCOUNTER — Other Ambulatory Visit: Payer: Self-pay | Admitting: Family Medicine

## 2023-05-13 ENCOUNTER — Encounter: Payer: Self-pay | Admitting: Family Medicine

## 2023-05-17 ENCOUNTER — Other Ambulatory Visit: Payer: Self-pay | Admitting: Family Medicine

## 2023-05-17 MED ORDER — TIRZEPATIDE-WEIGHT MANAGEMENT 2.5 MG/0.5ML ~~LOC~~ SOLN: 2.5000 mg | SUBCUTANEOUS | 2 refills | Status: DC

## 2023-06-09 ENCOUNTER — Other Ambulatory Visit: Payer: Self-pay | Admitting: Family Medicine

## 2023-06-09 MED ORDER — TIRZEPATIDE-WEIGHT MANAGEMENT 5 MG/0.5ML ~~LOC~~ SOLN: 5.0000 mg | SUBCUTANEOUS | 2 refills | Status: AC

## 2023-08-07 ENCOUNTER — Other Ambulatory Visit: Payer: Self-pay | Admitting: Family Medicine

## 2023-08-08 ENCOUNTER — Other Ambulatory Visit: Payer: Self-pay

## 2023-08-08 MED ORDER — FLUOXETINE HCL 40 MG PO CAPS: 40.0000 mg | ORAL_CAPSULE | Freq: Every day | ORAL | 0 refills | Status: AC

## 2023-08-08 MED ORDER — ARIPIPRAZOLE 5 MG PO TABS: 5.0000 mg | ORAL_TABLET | Freq: Every day | ORAL | 0 refills | Status: AC

## 2023-11-16 ENCOUNTER — Other Ambulatory Visit: Payer: Self-pay | Admitting: Family Medicine
